# Patient Record
Sex: Female | Born: 1935 | Race: White | Hispanic: No | Marital: Married | Smoking: Never smoker
Health system: Southern US, Community
[De-identification: ages and names within clinical notes are randomized; demographics above are authoritative.]

## PROBLEM LIST (undated history)

## (undated) DIAGNOSIS — M1990 Unspecified osteoarthritis, unspecified site: Secondary | ICD-10-CM

## (undated) DIAGNOSIS — I2510 Atherosclerotic heart disease of native coronary artery without angina pectoris: Secondary | ICD-10-CM

## (undated) DIAGNOSIS — E7800 Pure hypercholesterolemia, unspecified: Secondary | ICD-10-CM

## (undated) DIAGNOSIS — Z8739 Personal history of other diseases of the musculoskeletal system and connective tissue: Secondary | ICD-10-CM

## (undated) DIAGNOSIS — Z8619 Personal history of other infectious and parasitic diseases: Secondary | ICD-10-CM

## (undated) DIAGNOSIS — I10 Essential (primary) hypertension: Secondary | ICD-10-CM

## (undated) DIAGNOSIS — D45 Polycythemia vera: Secondary | ICD-10-CM

## (undated) HISTORY — DX: Polycythemia vera: D45

## (undated) HISTORY — DX: Personal history of other diseases of the musculoskeletal system and connective tissue: Z87.39

## (undated) HISTORY — DX: Essential (primary) hypertension: I10

## (undated) HISTORY — DX: Pure hypercholesterolemia, unspecified: E78.00

## (undated) HISTORY — DX: Atherosclerotic heart disease of native coronary artery without angina pectoris: I25.10

## (undated) HISTORY — PX: CHOLECYSTECTOMY: SHX55

## (undated) HISTORY — PX: APPENDECTOMY: SHX54

## (undated) HISTORY — DX: Personal history of other infectious and parasitic diseases: Z86.19

## (undated) HISTORY — DX: Unspecified osteoarthritis, unspecified site: M19.90

---

## 1968-04-30 HISTORY — PX: ABDOMINAL HYSTERECTOMY: SHX81

## 2005-01-16 ENCOUNTER — Ambulatory Visit: Payer: Self-pay | Admitting: Internal Medicine

## 2005-02-20 ENCOUNTER — Ambulatory Visit: Payer: Self-pay | Admitting: Internal Medicine

## 2005-08-22 ENCOUNTER — Ambulatory Visit: Payer: Self-pay | Admitting: Internal Medicine

## 2005-08-29 ENCOUNTER — Ambulatory Visit: Payer: Self-pay | Admitting: Internal Medicine

## 2005-10-09 ENCOUNTER — Ambulatory Visit: Payer: Self-pay | Admitting: Urology

## 2006-01-15 ENCOUNTER — Ambulatory Visit: Payer: Self-pay | Admitting: Internal Medicine

## 2006-05-01 ENCOUNTER — Ambulatory Visit: Payer: Self-pay | Admitting: Internal Medicine

## 2006-08-06 ENCOUNTER — Ambulatory Visit: Payer: Self-pay | Admitting: Specialist

## 2007-02-24 ENCOUNTER — Ambulatory Visit: Payer: Self-pay | Admitting: Specialist

## 2007-03-02 ENCOUNTER — Emergency Department: Payer: Self-pay | Admitting: Emergency Medicine

## 2007-03-11 ENCOUNTER — Ambulatory Visit: Payer: Self-pay | Admitting: Internal Medicine

## 2007-03-17 ENCOUNTER — Ambulatory Visit: Payer: Self-pay | Admitting: Internal Medicine

## 2007-05-06 ENCOUNTER — Ambulatory Visit: Payer: Self-pay | Admitting: Internal Medicine

## 2007-07-29 ENCOUNTER — Ambulatory Visit: Payer: Self-pay | Admitting: Specialist

## 2007-10-09 ENCOUNTER — Ambulatory Visit: Payer: Self-pay | Admitting: Internal Medicine

## 2008-01-21 ENCOUNTER — Ambulatory Visit: Payer: Self-pay | Admitting: Specialist

## 2008-04-11 IMAGING — CT CT CHEST W/ CM
1 series · 15 of 33 positions shown, 19 images · IV contrast (agent unspecified)
Comparison: none

REASON FOR EXAM: abnormal chest xray Giorgi to send films from [REDACTED]
COMMENTS:

PROCEDURE:     CT  - CT CHEST WITH CONTRAST  - August 22, 2005  [DATE]
RESULT:
HISTORY: Abnormal chest x-ray.

[Series 2: soft tissue · axial · 0.61mm/px · z∈[+554,+844]mm · 15 of 68 slices shown, 19 images]
[im 5/68  mediastinal]
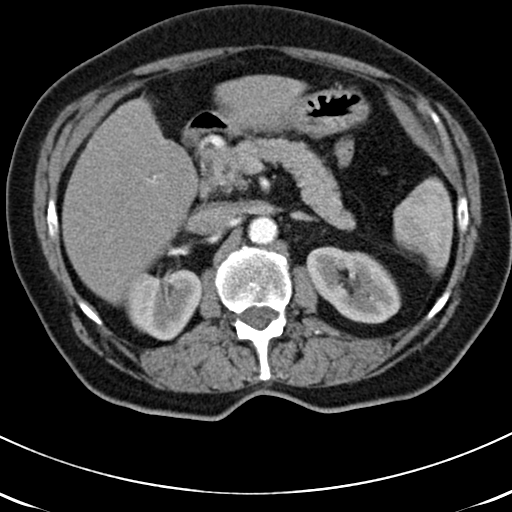
[im 5/68  lung]
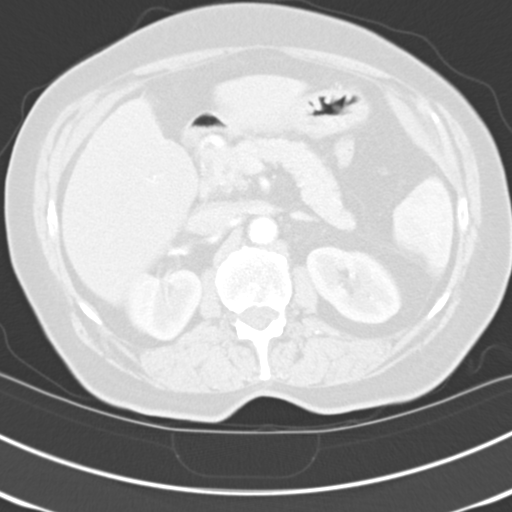
[im 10/68  lung]
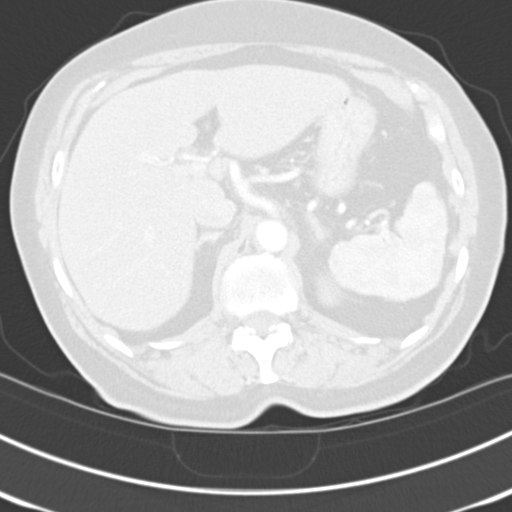
[im 14/68  lung]
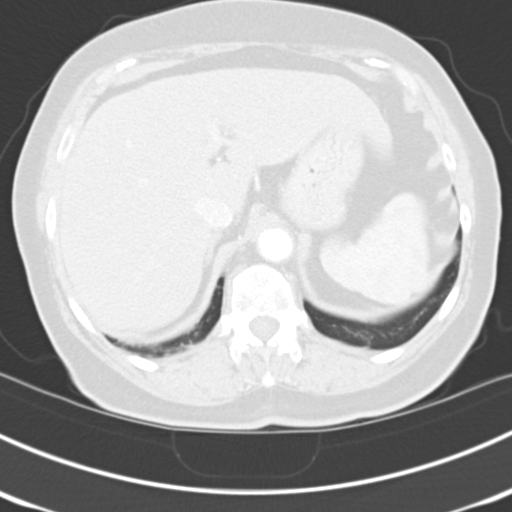
[im 18/68  lung]
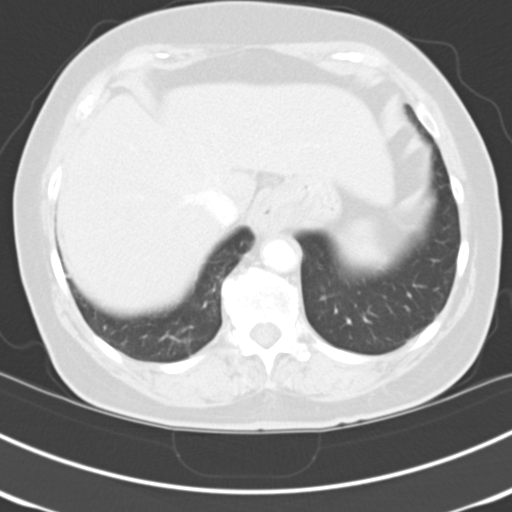
[im 23/68  mediastinal]
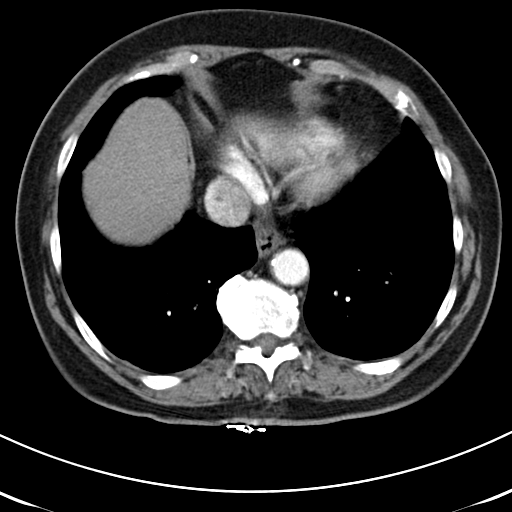
[im 23/68  lung]
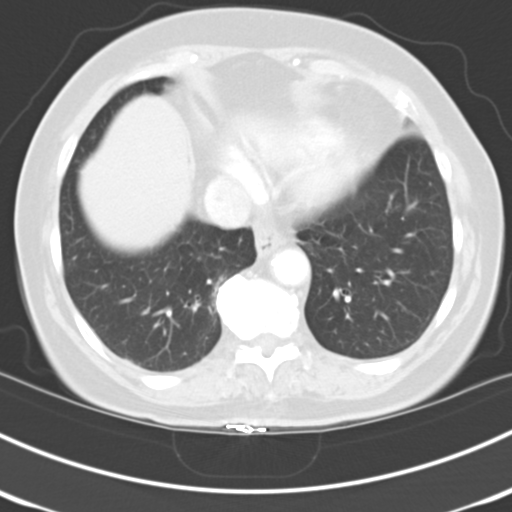
[im 27/68  lung]
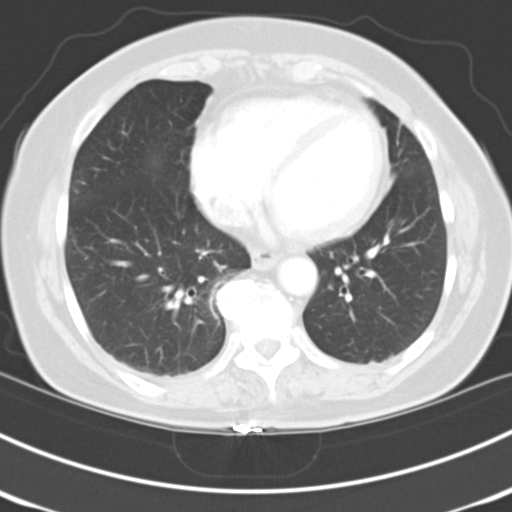
[im 30/68  lung]
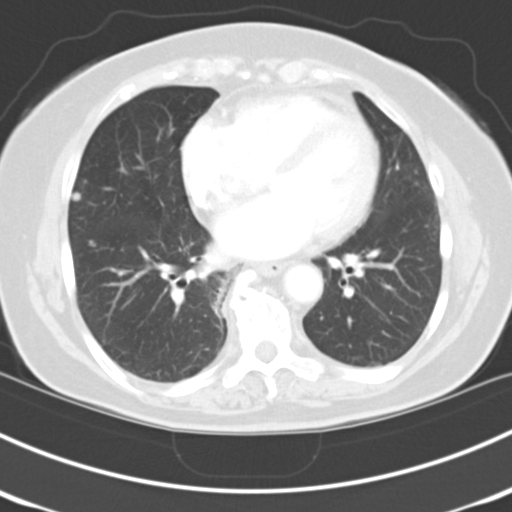
[im 35/68  lung]
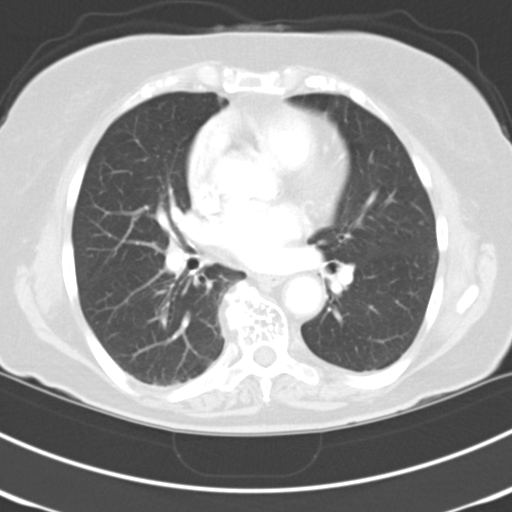
[im 38/68  mediastinal]
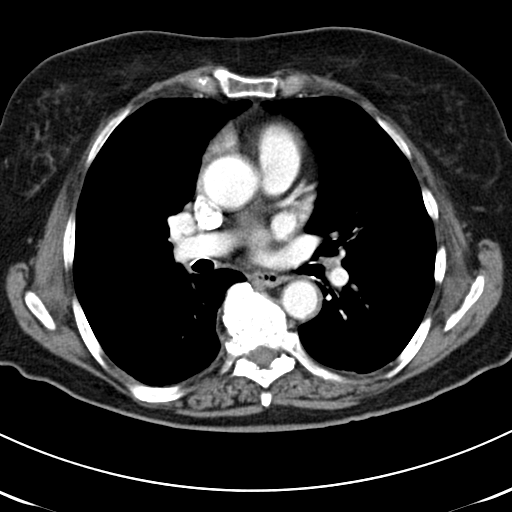
[im 38/68  lung]
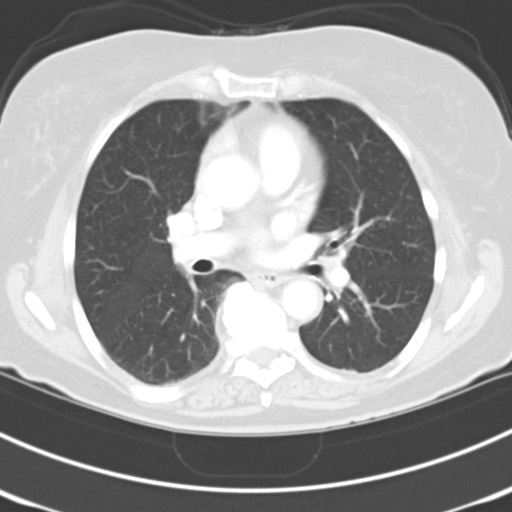
[im 41/68  lung]
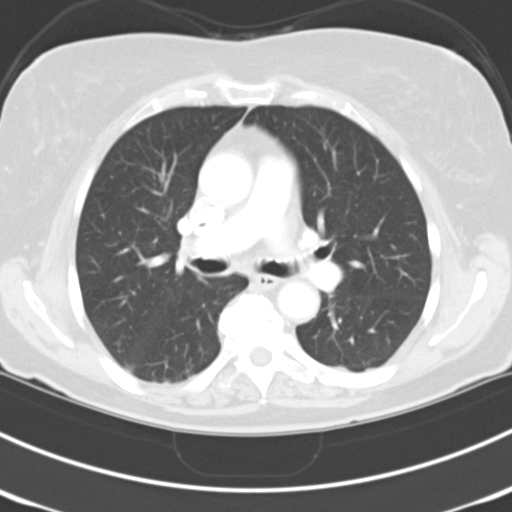
[im 45/68  lung]
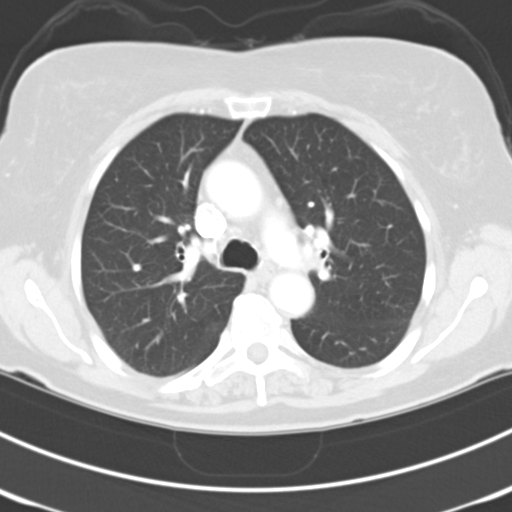
[im 50/68  lung]
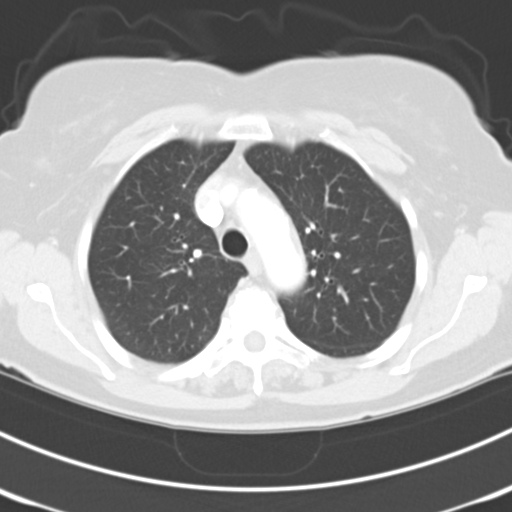
[im 54/68  mediastinal]
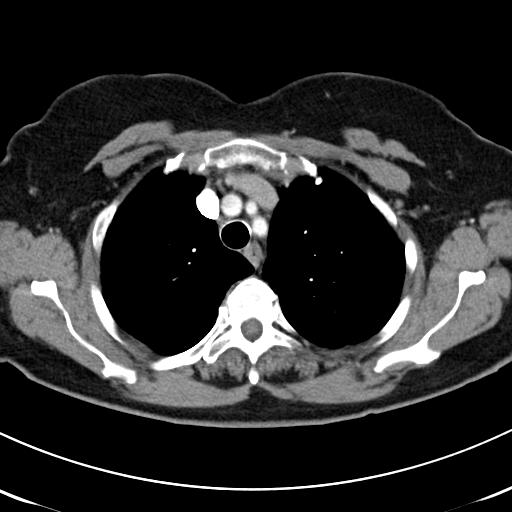
[im 54/68  lung]
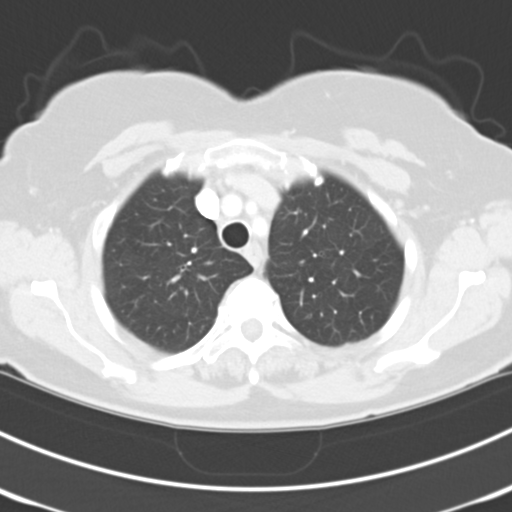
[im 58/68  lung]
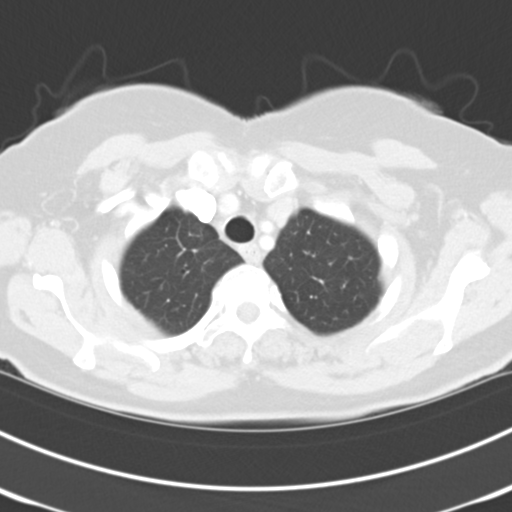
[im 63/68  lung]
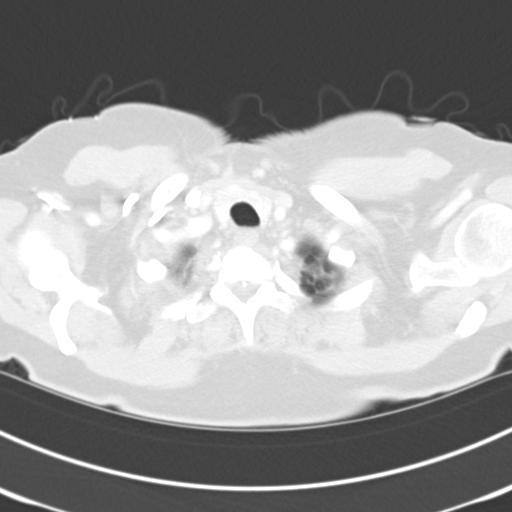

[15 of 33 positions shown; findings below may reference images not displayed]

COMPARISON STUDIES: No recent.

PROCEDURE AND FINDINGS: Shotty mediastinal lymph nodes are noted.  Coronary
artery calcification is present.  Pulmonary arteries are normal. The
adrenals are normal.  There is a tiny approximately 2 mm nodule in the
periphery of the RIGHT middle lobe.  Adjacent to this tiny pulmonary nodule
is an approximately 1 mm pulmonary nodule.  These nodules because of their
tiny size would be difficult to obtain an adequate biopsy.  PET CT would
also not likely be fruitful due to the tiny size of the nodules. We can
perform serial CT scans to follow these tiny nodules if surgical removal is
not performed.  CTs can be obtained at six-month intervals for a two-year
period.  Incidental note is made of possible RIGHT hydronephrosis on the
upper images of the abdomen that are imaged on this chest CT.  Renal
ultrasound is suggested for further evaluation. Surgical clips are noted in
the gallbladder fossa.
IMPRESSION: 1)Two, tiny, 1 to 2 mm pulmonary nodules in the periphery of the RIGHT
middle lobe. These are most likely too small for adequate percutaneous
biopsy or PET CT to be of use in evaluation. If surgical removal is not
contemplated, serial CTs at six-month intervals can be performed to
demonstrate stability.

2)Questionable hydronephrosis noted in the RIGHT kidney on the upper images
of the abdomen obtained on this Chest CT.  Renal ultrasound is suggested for
further evaluation.

## 2008-05-05 LAB — HM DEXA SCAN

## 2008-07-06 ENCOUNTER — Ambulatory Visit: Payer: Self-pay | Admitting: Internal Medicine

## 2008-07-06 LAB — HM MAMMOGRAPHY

## 2008-07-29 ENCOUNTER — Ambulatory Visit: Payer: Self-pay | Admitting: Internal Medicine

## 2008-08-18 ENCOUNTER — Ambulatory Visit: Payer: Self-pay | Admitting: Internal Medicine

## 2008-08-28 ENCOUNTER — Ambulatory Visit: Payer: Self-pay | Admitting: Internal Medicine

## 2008-08-28 HISTORY — PX: BONE MARROW BIOPSY: SHX199

## 2008-09-28 ENCOUNTER — Ambulatory Visit: Payer: Self-pay | Admitting: Internal Medicine

## 2008-10-06 ENCOUNTER — Ambulatory Visit: Payer: Self-pay | Admitting: Internal Medicine

## 2008-10-12 ENCOUNTER — Emergency Department: Payer: Self-pay | Admitting: Emergency Medicine

## 2008-10-28 ENCOUNTER — Ambulatory Visit: Payer: Self-pay | Admitting: Internal Medicine

## 2008-11-28 ENCOUNTER — Ambulatory Visit: Payer: Self-pay | Admitting: Internal Medicine

## 2008-12-29 ENCOUNTER — Ambulatory Visit: Payer: Self-pay | Admitting: Internal Medicine

## 2009-01-28 ENCOUNTER — Ambulatory Visit: Payer: Self-pay | Admitting: Internal Medicine

## 2009-02-28 ENCOUNTER — Ambulatory Visit: Payer: Self-pay | Admitting: Internal Medicine

## 2009-03-08 ENCOUNTER — Ambulatory Visit: Payer: Self-pay | Admitting: Internal Medicine

## 2009-03-26 IMAGING — CT CT CHEST W/ CM
1 series · 15 of 33 positions shown, 19 images · non-contrast
Comparison: none

REASON FOR EXAM: Pulmonary nodules
COMMENTS:

[Series 2: soft tissue · axial · 0.62mm/px · z∈[-185,+80]mm · 15 of 63 slices shown, 19 images]
[im 5/63  mediastinal]
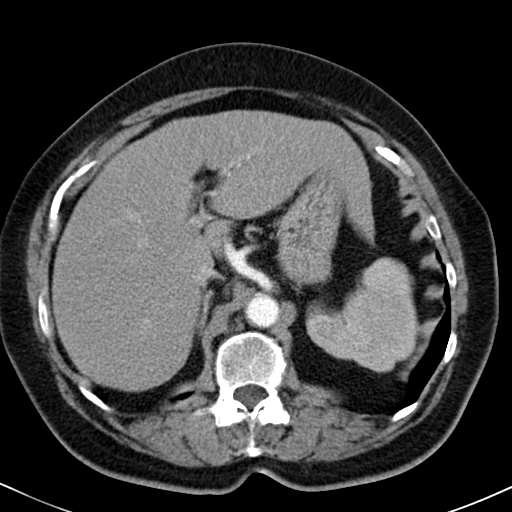
[im 5/63  lung]
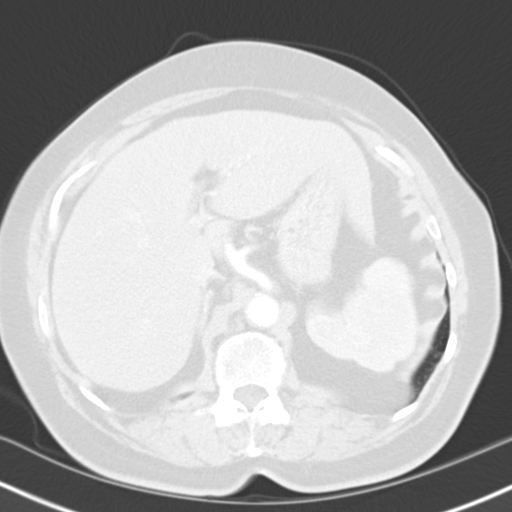
[im 10/63  lung]
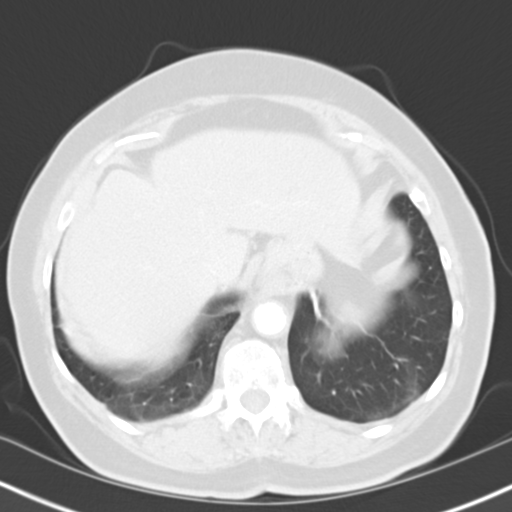
[im 13/63  lung]
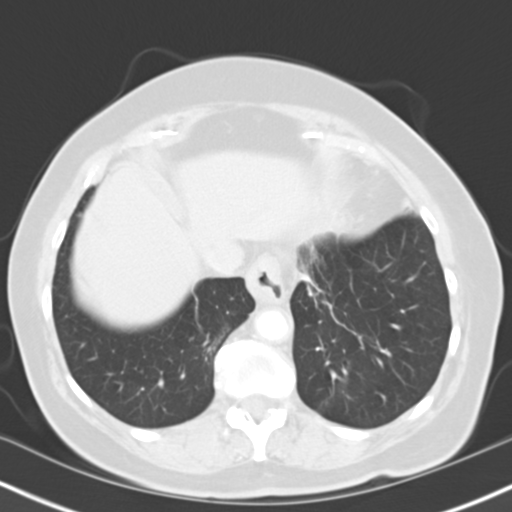
[im 17/63  lung]
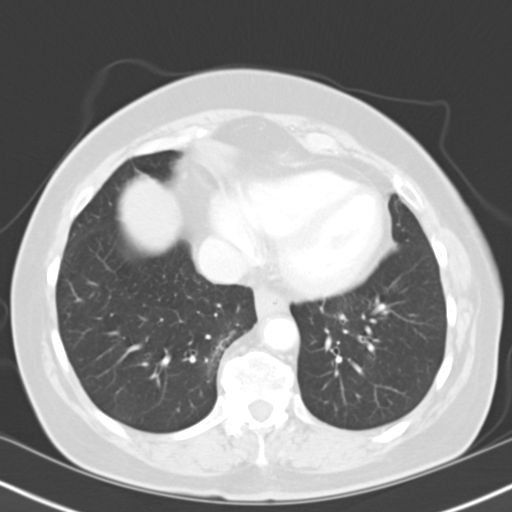
[im 21/63  mediastinal]
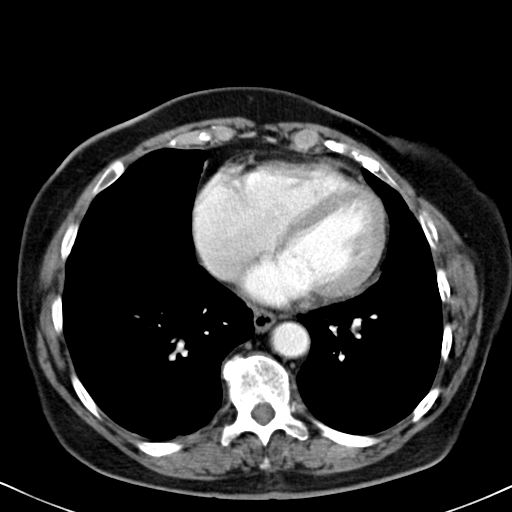
[im 21/63  lung]
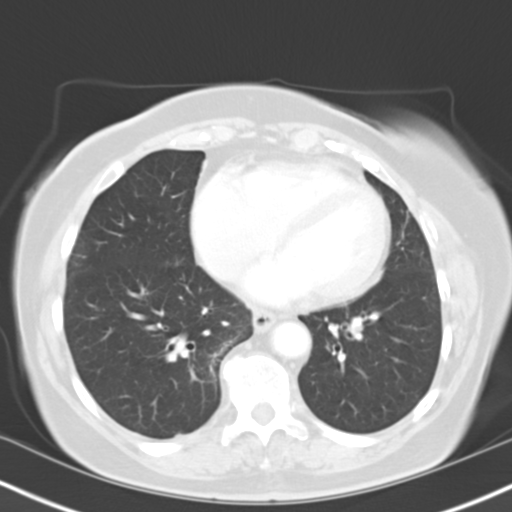
[im 25/63  lung]
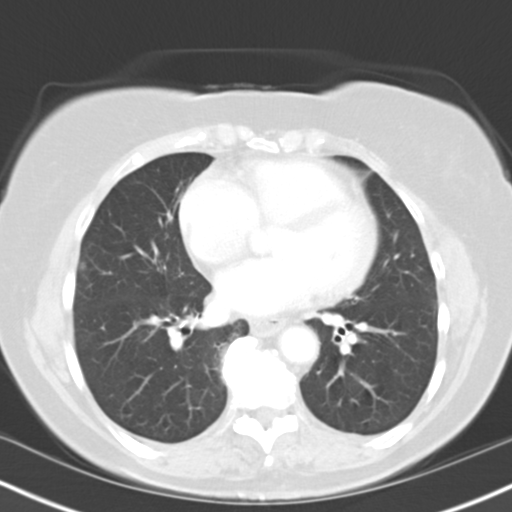
[im 28/63  lung]
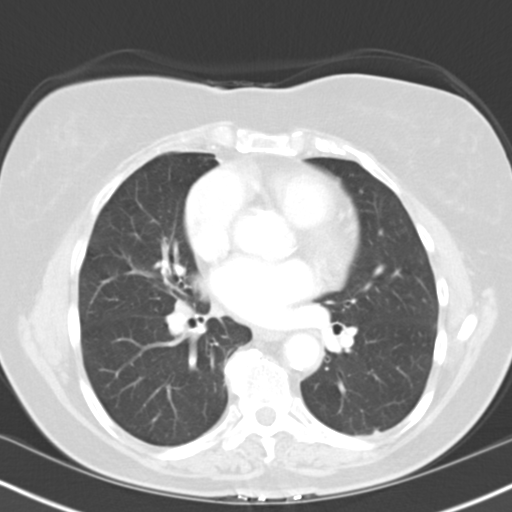
[im 33/63  lung]
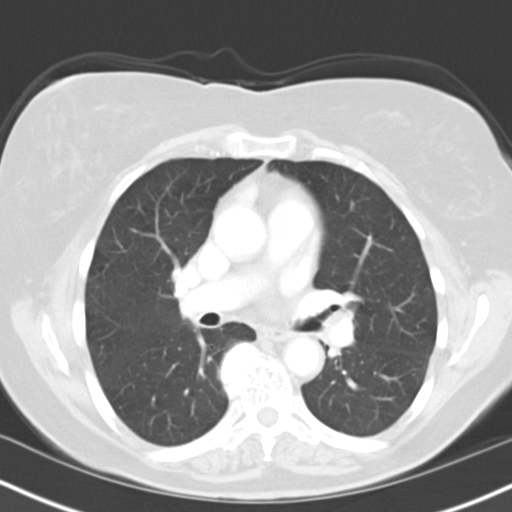
[im 35/63  mediastinal]
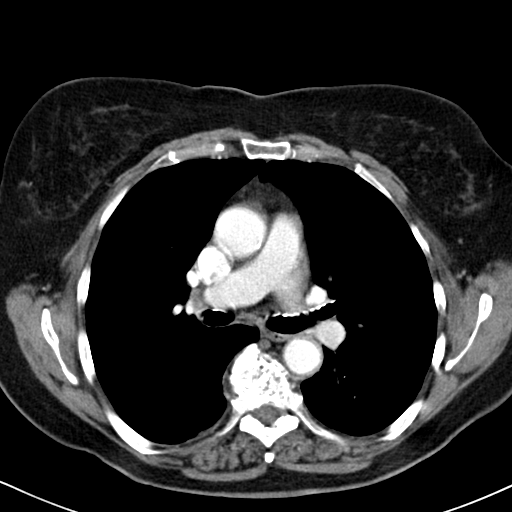
[im 35/63  lung]
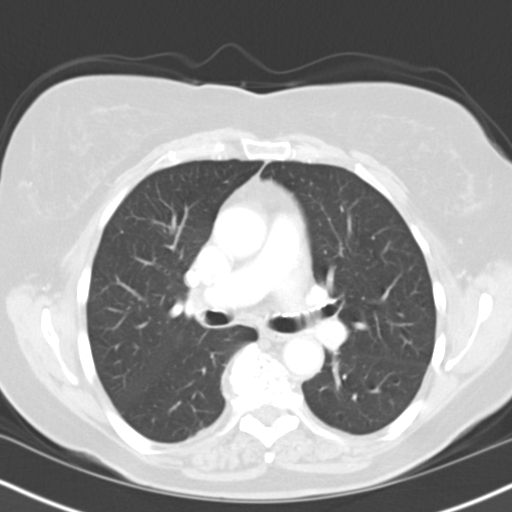
[im 38/63  lung]
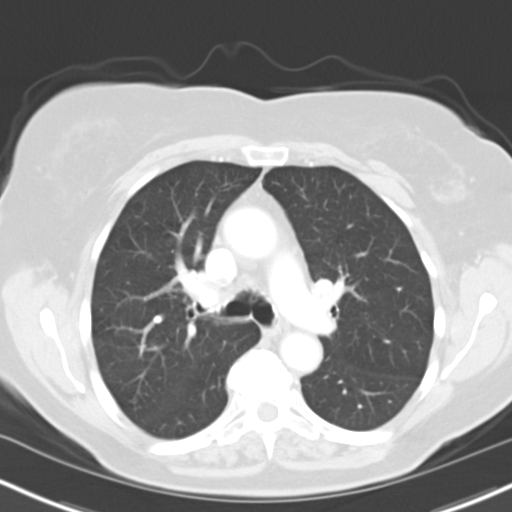
[im 42/63  lung]
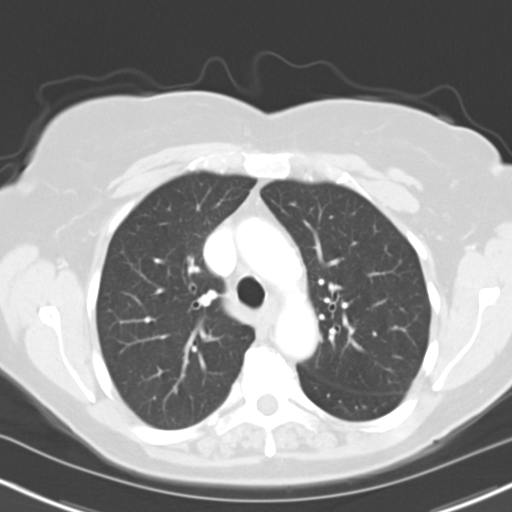
[im 46/63  lung]
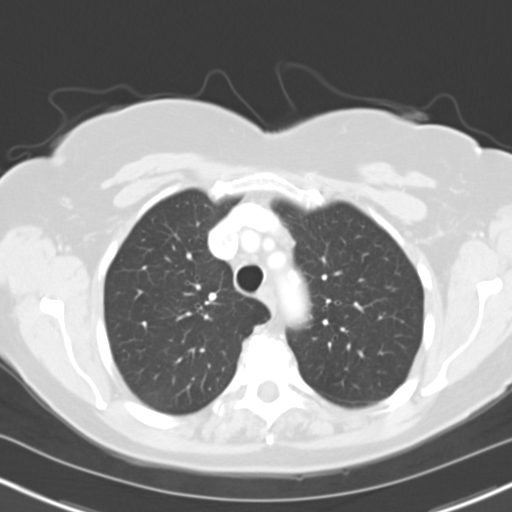
[im 50/63  mediastinal]
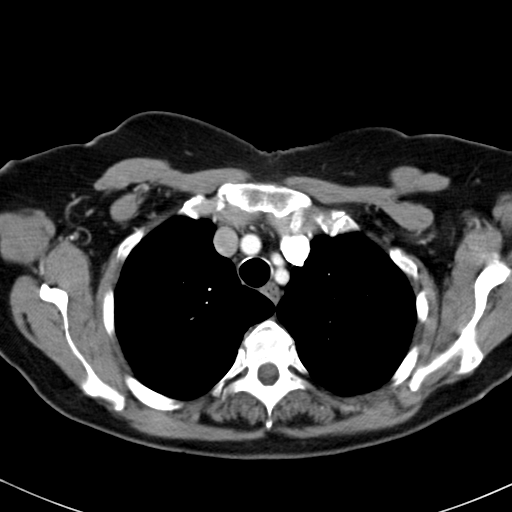
[im 50/63  lung]
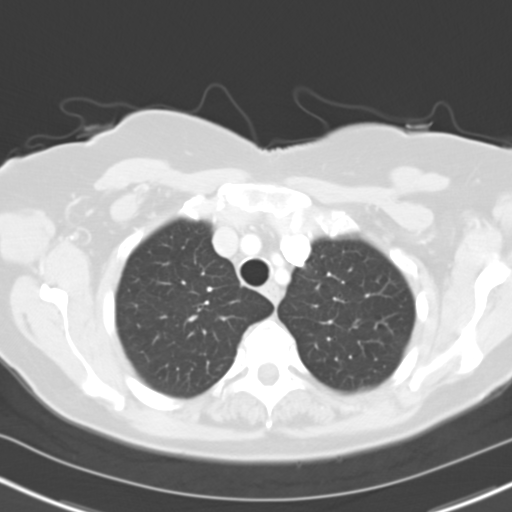
[im 53/63  lung]
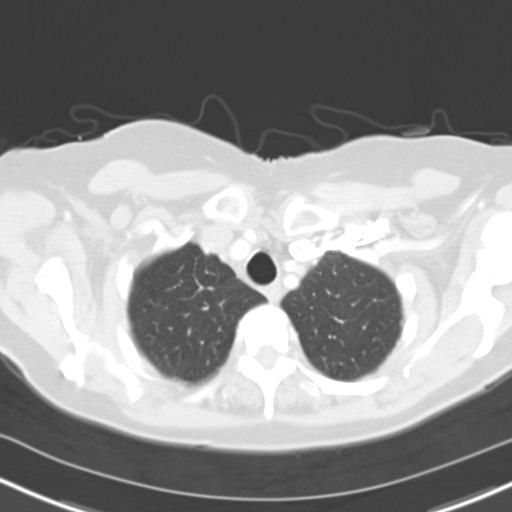
[im 58/63  lung]
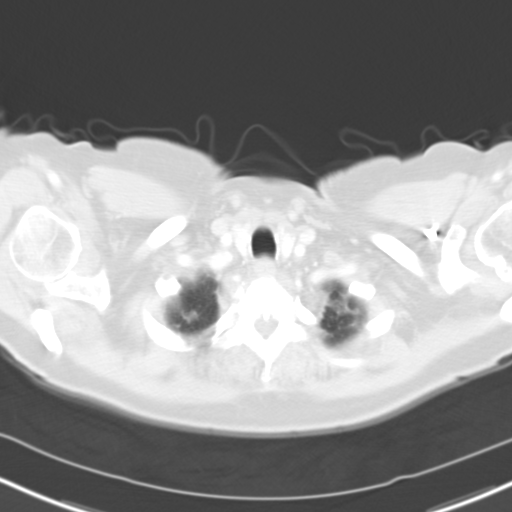

[15 of 33 positions shown; findings below may reference images not displayed]

PROCEDURE:     CT  - CT CHEST WITH CONTRAST  - August 06, 2006 [DATE]

RESULT:     Multislice helical acquisition through the chest is compared to
the study of 01/15/06.  There is a hiatal hernia present. The heart is at the
upper limits of normal in size. There is no effusion. There is no
mediastinal or hilar mass, or adenopathy. The lung window images again show
tiny areas of nodularity at the level of the minor fissure on the RIGHT. The
largest of these is on image #40 and measures up to as much as 5.5 mm on the
lung window settings. These areas appear to be stable and show no
calcification.  These are not evident on the mediastinal window settings. No
new nodules are present. There is no effusion or pneumothorax. Degenerative
spurring is present in the thoracic spine.
IMPRESSION: Stable chest CT with stable areas of minimal nodularity. These are stable
when compared to previous images dating back to Friday July, 2005. These must be
followed for another year to document stability. Six-month followup would be
suggested.

## 2009-03-30 ENCOUNTER — Ambulatory Visit: Payer: Self-pay | Admitting: Internal Medicine

## 2009-04-30 ENCOUNTER — Ambulatory Visit: Payer: Self-pay | Admitting: Internal Medicine

## 2009-05-03 ENCOUNTER — Ambulatory Visit: Payer: Self-pay | Admitting: Internal Medicine

## 2009-05-31 ENCOUNTER — Ambulatory Visit: Payer: Self-pay | Admitting: Internal Medicine

## 2009-06-28 ENCOUNTER — Ambulatory Visit: Payer: Self-pay | Admitting: Internal Medicine

## 2009-07-06 ENCOUNTER — Ambulatory Visit: Payer: Self-pay | Admitting: Internal Medicine

## 2009-07-29 ENCOUNTER — Ambulatory Visit: Payer: Self-pay | Admitting: Internal Medicine

## 2009-08-28 ENCOUNTER — Ambulatory Visit: Payer: Self-pay | Admitting: Internal Medicine

## 2009-09-06 ENCOUNTER — Ambulatory Visit: Payer: Self-pay | Admitting: Internal Medicine

## 2009-09-28 ENCOUNTER — Ambulatory Visit: Payer: Self-pay | Admitting: Internal Medicine

## 2009-10-28 ENCOUNTER — Ambulatory Visit: Payer: Self-pay | Admitting: Internal Medicine

## 2009-11-10 ENCOUNTER — Ambulatory Visit: Payer: Self-pay | Admitting: Internal Medicine

## 2009-11-28 ENCOUNTER — Ambulatory Visit: Payer: Self-pay | Admitting: Internal Medicine

## 2009-12-29 ENCOUNTER — Ambulatory Visit: Payer: Self-pay | Admitting: Internal Medicine

## 2010-01-17 ENCOUNTER — Ambulatory Visit: Payer: Self-pay | Admitting: Internal Medicine

## 2010-01-28 ENCOUNTER — Ambulatory Visit: Payer: Self-pay | Admitting: Internal Medicine

## 2010-04-25 ENCOUNTER — Ambulatory Visit: Payer: Self-pay | Admitting: Internal Medicine

## 2010-04-30 ENCOUNTER — Ambulatory Visit: Payer: Self-pay | Admitting: Internal Medicine

## 2010-08-24 ENCOUNTER — Emergency Department: Payer: Self-pay | Admitting: Internal Medicine

## 2010-08-29 ENCOUNTER — Ambulatory Visit: Payer: Self-pay | Admitting: Internal Medicine

## 2010-09-29 ENCOUNTER — Ambulatory Visit: Payer: Self-pay | Admitting: Internal Medicine

## 2010-12-17 ENCOUNTER — Emergency Department: Payer: Self-pay | Admitting: *Deleted

## 2010-12-23 ENCOUNTER — Emergency Department: Payer: Self-pay | Admitting: *Deleted

## 2011-03-06 ENCOUNTER — Ambulatory Visit: Payer: Self-pay | Admitting: Ophthalmology

## 2011-03-06 HISTORY — PX: CATARACT EXTRACTION: SUR2

## 2011-03-13 ENCOUNTER — Ambulatory Visit: Payer: Self-pay | Admitting: Internal Medicine

## 2011-03-31 ENCOUNTER — Ambulatory Visit: Payer: Self-pay | Admitting: Internal Medicine

## 2011-09-11 ENCOUNTER — Ambulatory Visit: Payer: Self-pay | Admitting: Ophthalmology

## 2011-09-26 ENCOUNTER — Ambulatory Visit: Payer: Self-pay | Admitting: Internal Medicine

## 2011-09-26 LAB — CBC CANCER CENTER
Basophil #: 0.1 x10 3/mm (ref 0.0–0.1)
Basophil %: 1 %
Eosinophil #: 0.2 x10 3/mm (ref 0.0–0.7)
Eosinophil %: 2 %
HCT: 43.5 % (ref 35.0–47.0)
Lymphocyte #: 2.2 x10 3/mm (ref 1.0–3.6)
MCH: 34.6 pg — ABNORMAL HIGH (ref 26.0–34.0)
MCHC: 32.6 g/dL (ref 32.0–36.0)
MCV: 106 fL — ABNORMAL HIGH (ref 80–100)
Monocyte #: 1.1 x10 3/mm — ABNORMAL HIGH (ref 0.2–0.9)
Neutrophil %: 62.1 %
Platelet: 347 x10 3/mm (ref 150–440)
RBC: 4.09 10*6/uL (ref 3.80–5.20)
RDW: 14.5 % (ref 11.5–14.5)

## 2011-09-29 ENCOUNTER — Ambulatory Visit: Payer: Self-pay | Admitting: Internal Medicine

## 2011-10-02 LAB — CBC CANCER CENTER
Basophil %: 1.9 %
Eosinophil #: 0.2 x10 3/mm (ref 0.0–0.7)
Eosinophil %: 2.7 %
HCT: 44.2 % (ref 35.0–47.0)
Lymphocyte %: 24 %
MCH: 35 pg — ABNORMAL HIGH (ref 26.0–34.0)
MCHC: 32.6 g/dL (ref 32.0–36.0)
Monocyte #: 0.9 x10 3/mm (ref 0.2–0.9)
Neutrophil #: 5.7 x10 3/mm (ref 1.4–6.5)
Neutrophil %: 61.7 %
Platelet: 326 x10 3/mm (ref 150–440)
RDW: 14.4 % (ref 11.5–14.5)

## 2011-10-09 LAB — CBC CANCER CENTER
Basophil #: 0.1 x10 3/mm (ref 0.0–0.1)
HGB: 14.5 g/dL (ref 12.0–16.0)
Lymphocyte %: 24 %
MCH: 35.5 pg — ABNORMAL HIGH (ref 26.0–34.0)
MCHC: 33.2 g/dL (ref 32.0–36.0)
MCV: 107 fL — ABNORMAL HIGH (ref 80–100)
Monocyte %: 11.1 %
Neutrophil %: 62 %
Platelet: 335 x10 3/mm (ref 150–440)
RDW: 14.8 % — ABNORMAL HIGH (ref 11.5–14.5)
WBC: 9.1 x10 3/mm (ref 3.6–11.0)

## 2011-10-16 LAB — CBC CANCER CENTER
Basophil #: 0 x10 3/mm (ref 0.0–0.1)
Lymphocyte #: 2.1 x10 3/mm (ref 1.0–3.6)
Lymphocyte %: 22.4 %
MCH: 35.2 pg — ABNORMAL HIGH (ref 26.0–34.0)
MCHC: 32.5 g/dL (ref 32.0–36.0)
Monocyte %: 8.4 %
Neutrophil #: 6.2 x10 3/mm (ref 1.4–6.5)
Neutrophil %: 66.2 %
WBC: 9.4 x10 3/mm (ref 3.6–11.0)

## 2011-10-22 LAB — CBC CANCER CENTER
Basophil #: 0.2 x10 3/mm — ABNORMAL HIGH (ref 0.0–0.1)
Basophil %: 2.2 %
Eosinophil #: 0.2 x10 3/mm (ref 0.0–0.7)
Eosinophil %: 2.1 %
HCT: 43.7 % (ref 35.0–47.0)
MCH: 35.1 pg — ABNORMAL HIGH (ref 26.0–34.0)
MCHC: 32.4 g/dL (ref 32.0–36.0)
Monocyte #: 0.9 x10 3/mm (ref 0.2–0.9)
Monocyte %: 10.6 %
Neutrophil #: 5.2 x10 3/mm (ref 1.4–6.5)
Platelet: 322 x10 3/mm (ref 150–440)
RBC: 4.04 10*6/uL (ref 3.80–5.20)
RDW: 14.4 % (ref 11.5–14.5)

## 2011-10-29 ENCOUNTER — Ambulatory Visit: Payer: Self-pay | Admitting: Internal Medicine

## 2011-11-19 LAB — CBC CANCER CENTER
Basophil %: 1.2 %
Eosinophil #: 0.2 x10 3/mm (ref 0.0–0.7)
Eosinophil %: 1.8 %
HCT: 44.6 % (ref 35.0–47.0)
HGB: 14.4 g/dL (ref 12.0–16.0)
Lymphocyte %: 24 %
MCH: 35.1 pg — ABNORMAL HIGH (ref 26.0–34.0)
MCHC: 32.2 g/dL (ref 32.0–36.0)
Neutrophil %: 63.6 %
Platelet: 315 x10 3/mm (ref 150–440)
WBC: 9.5 x10 3/mm (ref 3.6–11.0)

## 2011-11-26 LAB — CBC CANCER CENTER
Basophil #: 0.1 x10 3/mm (ref 0.0–0.1)
Basophil %: 1.1 %
HGB: 14.1 g/dL (ref 12.0–16.0)
Lymphocyte #: 1.9 x10 3/mm (ref 1.0–3.6)
Lymphocyte %: 24.9 %
MCV: 109 fL — ABNORMAL HIGH (ref 80–100)
Monocyte #: 0.6 x10 3/mm (ref 0.2–0.9)
Monocyte %: 8.5 %
Neutrophil %: 63.8 %
Platelet: 323 x10 3/mm (ref 150–440)
RDW: 14.9 % — ABNORMAL HIGH (ref 11.5–14.5)
WBC: 7.5 x10 3/mm (ref 3.6–11.0)

## 2011-11-29 ENCOUNTER — Ambulatory Visit: Payer: Self-pay | Admitting: Internal Medicine

## 2011-12-06 LAB — CBC CANCER CENTER
Basophil #: 0.1 x10 3/mm (ref 0.0–0.1)
Basophil %: 1.2 %
Eosinophil %: 2 %
HCT: 42.7 % (ref 35.0–47.0)
HGB: 14.4 g/dL (ref 12.0–16.0)
MCH: 37.5 pg — ABNORMAL HIGH (ref 26.0–34.0)
MCV: 111 fL — ABNORMAL HIGH (ref 80–100)
Monocyte #: 0.6 x10 3/mm (ref 0.2–0.9)
Neutrophil %: 65.3 %
RBC: 3.84 10*6/uL (ref 3.80–5.20)
RDW: 15.1 % — ABNORMAL HIGH (ref 11.5–14.5)

## 2011-12-18 LAB — CBC CANCER CENTER
Basophil #: 0.1 x10 3/mm (ref 0.0–0.1)
Eosinophil #: 0.2 x10 3/mm (ref 0.0–0.7)
HCT: 41.4 % (ref 35.0–47.0)
HGB: 13.4 g/dL (ref 12.0–16.0)
Lymphocyte %: 23.4 %
MCH: 36.1 pg — ABNORMAL HIGH (ref 26.0–34.0)
MCHC: 32.4 g/dL (ref 32.0–36.0)
MCV: 112 fL — ABNORMAL HIGH (ref 80–100)
Monocyte #: 0.7 x10 3/mm (ref 0.2–0.9)
Monocyte %: 8.4 %
Neutrophil #: 5.2 x10 3/mm (ref 1.4–6.5)
Neutrophil %: 64.9 %
RDW: 15.7 % — ABNORMAL HIGH (ref 11.5–14.5)

## 2011-12-30 ENCOUNTER — Ambulatory Visit: Payer: Self-pay | Admitting: Internal Medicine

## 2012-01-03 LAB — CBC CANCER CENTER
Basophil #: 0.1 x10 3/mm (ref 0.0–0.1)
Basophil %: 1.1 %
HCT: 41.6 % (ref 35.0–47.0)
HGB: 13.4 g/dL (ref 12.0–16.0)
Lymphocyte #: 2.1 x10 3/mm (ref 1.0–3.6)
MCH: 36.3 pg — ABNORMAL HIGH (ref 26.0–34.0)
MCHC: 32.2 g/dL (ref 32.0–36.0)
MCV: 113 fL — ABNORMAL HIGH (ref 80–100)
Monocyte %: 6.1 %
Neutrophil #: 6 x10 3/mm (ref 1.4–6.5)
RDW: 15.7 % — ABNORMAL HIGH (ref 11.5–14.5)
WBC: 8.9 x10 3/mm (ref 3.6–11.0)

## 2012-01-17 LAB — CBC CANCER CENTER
Basophil #: 0.1 x10 3/mm (ref 0.0–0.1)
Eosinophil #: 0.2 x10 3/mm (ref 0.0–0.7)
HCT: 41.8 % (ref 35.0–47.0)
HGB: 13.5 g/dL (ref 12.0–16.0)
Lymphocyte #: 1.8 x10 3/mm (ref 1.0–3.6)
Lymphocyte %: 20.7 %
MCHC: 32.3 g/dL (ref 32.0–36.0)
MCV: 114 fL — ABNORMAL HIGH (ref 80–100)
Monocyte #: 0.5 x10 3/mm (ref 0.2–0.9)
Monocyte %: 6.1 %
Neutrophil %: 70 %
Platelet: 252 x10 3/mm (ref 150–440)
RDW: 15.4 % — ABNORMAL HIGH (ref 11.5–14.5)
WBC: 8.6 x10 3/mm (ref 3.6–11.0)

## 2012-01-29 ENCOUNTER — Ambulatory Visit: Payer: Self-pay | Admitting: Internal Medicine

## 2012-01-31 LAB — CBC CANCER CENTER
Eosinophil #: 0.2 x10 3/mm (ref 0.0–0.7)
HCT: 41.1 % (ref 35.0–47.0)
Lymphocyte #: 2.3 x10 3/mm (ref 1.0–3.6)
MCH: 37.2 pg — ABNORMAL HIGH (ref 26.0–34.0)
MCV: 114 fL — ABNORMAL HIGH (ref 80–100)
Neutrophil #: 5.3 x10 3/mm (ref 1.4–6.5)
Platelet: 263 x10 3/mm (ref 150–440)
RDW: 15.6 % — ABNORMAL HIGH (ref 11.5–14.5)

## 2012-02-25 LAB — CBC CANCER CENTER
Basophil #: 0.1 x10 3/mm (ref 0.0–0.1)
Eosinophil #: 0.2 x10 3/mm (ref 0.0–0.7)
Eosinophil %: 2.5 %
HCT: 42.2 % (ref 35.0–47.0)
Lymphocyte #: 2 x10 3/mm (ref 1.0–3.6)
Lymphocyte %: 26.4 %
MCH: 37.2 pg — ABNORMAL HIGH (ref 26.0–34.0)
MCHC: 32.2 g/dL (ref 32.0–36.0)
Monocyte #: 0.6 x10 3/mm (ref 0.2–0.9)
Neutrophil #: 4.5 x10 3/mm (ref 1.4–6.5)
Neutrophil %: 61.4 %
RDW: 15 % — ABNORMAL HIGH (ref 11.5–14.5)

## 2012-02-29 ENCOUNTER — Ambulatory Visit: Payer: Self-pay | Admitting: Internal Medicine

## 2012-03-05 LAB — CANCER CENTER HEMATOCRIT: HCT: 43.2 % (ref 35.0–47.0)

## 2012-03-11 ENCOUNTER — Telehealth: Payer: Self-pay | Admitting: Internal Medicine

## 2012-03-11 NOTE — Telephone Encounter (Signed)
Tina Bond left message wanting someone to call her back she has ? About her meds

## 2012-03-12 NOTE — Telephone Encounter (Signed)
Patient wants to know how she can get her meds refilled through [facility], if you will let them know that she going coming to you for her perscription? She is con concerned that they want pay. Will check with pharmacy to make sure her meds are right.

## 2012-03-12 NOTE — Telephone Encounter (Signed)
Called and spoke to patient.  She does not need refills now.  Will have her current med list faxed from [facility] and she will call back when she needs her prescriptions sent in to [facility].

## 2012-03-14 ENCOUNTER — Encounter: Payer: Self-pay | Admitting: Internal Medicine

## 2012-03-30 ENCOUNTER — Ambulatory Visit: Payer: Self-pay | Admitting: Internal Medicine

## 2012-04-03 LAB — CBC CANCER CENTER
Basophil #: 0.1 x10 3/mm (ref 0.0–0.1)
Basophil %: 1.4 %
Eosinophil #: 0.2 x10 3/mm (ref 0.0–0.7)
Eosinophil %: 2.6 %
HCT: 40 % (ref 35.0–47.0)
HGB: 13.5 g/dL (ref 12.0–16.0)
Lymphocyte #: 2 x10 3/mm (ref 1.0–3.6)
Lymphocyte %: 22.2 %
MCH: 37.5 pg — ABNORMAL HIGH (ref 26.0–34.0)
MCHC: 33.7 g/dL (ref 32.0–36.0)
MCV: 112 fL — ABNORMAL HIGH (ref 80–100)
Monocyte #: 0.7 x10 3/mm (ref 0.2–0.9)
Monocyte %: 8.1 %
Neutrophil #: 5.9 x10 3/mm (ref 1.4–6.5)
Neutrophil %: 65.7 %
Platelet: 282 x10 3/mm (ref 150–440)
RBC: 3.59 10*6/uL — ABNORMAL LOW (ref 3.80–5.20)
RDW: 14 % (ref 11.5–14.5)
WBC: 9 x10 3/mm (ref 3.6–11.0)

## 2012-04-14 ENCOUNTER — Other Ambulatory Visit: Payer: Self-pay | Admitting: Internal Medicine

## 2012-04-14 NOTE — Telephone Encounter (Signed)
Pt is needing B/P meds (Generic) refilled. She uses [facility].

## 2012-04-17 NOTE — Telephone Encounter (Signed)
Called patient to make sure that [facility] is the right pharmacy. Left message for patient to return call

## 2012-04-18 MED ORDER — AMLODIPINE BESYLATE 10 MG PO TABS: 10.0000 mg | ORAL_TABLET | Freq: Every day | ORAL | Status: DC

## 2012-04-18 MED ORDER — LISINOPRIL 40 MG PO TABS: 40.0000 mg | ORAL_TABLET | Freq: Every day | ORAL | Status: DC

## 2012-04-18 NOTE — Telephone Encounter (Signed)
Sent in to pharmacy.  

## 2012-04-24 ENCOUNTER — Other Ambulatory Visit: Payer: Self-pay | Admitting: Internal Medicine

## 2012-04-24 MED ORDER — SERTRALINE HCL 50 MG PO TABS: 50.0000 mg | ORAL_TABLET | Freq: Every day | ORAL | Status: DC

## 2012-04-24 NOTE — Telephone Encounter (Signed)
Pt has not been seen here yet. Pt has an appt on 06/10/12.

## 2012-04-24 NOTE — Telephone Encounter (Signed)
Called in [facility] - zoloft 50mg  q day #30 with 2 refills.

## 2012-04-24 NOTE — Telephone Encounter (Signed)
Pt is needing refill on Sertraline HCL 50 mg tablets QTY 30

## 2012-04-28 ENCOUNTER — Other Ambulatory Visit: Payer: Self-pay | Admitting: Internal Medicine

## 2012-04-28 NOTE — Telephone Encounter (Signed)
Atorvastatin 40 mg tabs  Take 1 tablet daily  # 90

## 2012-04-29 MED ORDER — ATORVASTATIN CALCIUM 40 MG PO TABS: 40.0000 mg | ORAL_TABLET | Freq: Every day | ORAL | Status: DC

## 2012-04-29 NOTE — Telephone Encounter (Signed)
Sent in to pharmacy.  

## 2012-04-30 ENCOUNTER — Ambulatory Visit: Payer: Self-pay | Admitting: Internal Medicine

## 2012-05-08 LAB — CBC CANCER CENTER
Basophil #: 0.1 x10 3/mm (ref 0.0–0.1)
Basophil %: 1.4 %
Eosinophil %: 2.4 %
HGB: 13.5 g/dL (ref 12.0–16.0)
MCHC: 33.8 g/dL (ref 32.0–36.0)
MCV: 110 fL — ABNORMAL HIGH (ref 80–100)
Monocyte #: 0.7 x10 3/mm (ref 0.2–0.9)
Monocyte %: 8.6 %
Neutrophil %: 66.8 %
RBC: 3.61 10*6/uL — ABNORMAL LOW (ref 3.80–5.20)
RDW: 14.1 % (ref 11.5–14.5)

## 2012-05-13 ENCOUNTER — Other Ambulatory Visit: Payer: Self-pay | Admitting: Internal Medicine

## 2012-05-13 MED ORDER — HYDROCHLOROTHIAZIDE 25 MG PO TABS: 25.0000 mg | ORAL_TABLET | Freq: Every day | ORAL | Status: DC

## 2012-05-13 NOTE — Telephone Encounter (Signed)
Sent in to pharmacy.  

## 2012-05-13 NOTE — Telephone Encounter (Signed)
Ok #90 with no refills.  Will need to keep appt

## 2012-05-13 NOTE — Telephone Encounter (Signed)
Do you want to give patient 90 day supply? We have not seen her in this office yet. Has appointment 06/10/12. Please advise?

## 2012-05-13 NOTE — Telephone Encounter (Signed)
hydrochlorothiazide (HYDRODIURIL) 25 MG tablet °# 90 °

## 2012-05-22 ENCOUNTER — Other Ambulatory Visit: Payer: Self-pay | Admitting: Internal Medicine

## 2012-05-22 NOTE — Telephone Encounter (Signed)
Pt called, needing BP med refill on Amlodipine besylate 10 mg, gets through [facility].  Pt has a New Pt appt scheduled 06/10/2012 with Dr. Lorin Picket.

## 2012-05-26 NOTE — Telephone Encounter (Signed)
Sent in to pharmacy.  

## 2012-05-31 ENCOUNTER — Ambulatory Visit: Payer: Self-pay | Admitting: Internal Medicine

## 2012-06-06 ENCOUNTER — Encounter: Payer: Self-pay | Admitting: Internal Medicine

## 2012-06-10 ENCOUNTER — Ambulatory Visit (INDEPENDENT_AMBULATORY_CARE_PROVIDER_SITE_OTHER): Payer: 59 | Admitting: Internal Medicine

## 2012-06-10 ENCOUNTER — Encounter: Payer: Self-pay | Admitting: Internal Medicine

## 2012-06-10 VITALS — BP 130/60 | HR 86 | Temp 97.9°F | Ht 64.5 in | Wt 171.5 lb

## 2012-06-10 DIAGNOSIS — I10 Essential (primary) hypertension: Secondary | ICD-10-CM

## 2012-06-10 DIAGNOSIS — F329 Major depressive disorder, single episode, unspecified: Secondary | ICD-10-CM

## 2012-06-10 DIAGNOSIS — K5730 Diverticulosis of large intestine without perforation or abscess without bleeding: Secondary | ICD-10-CM

## 2012-06-10 DIAGNOSIS — K5790 Diverticulosis of intestine, part unspecified, without perforation or abscess without bleeding: Secondary | ICD-10-CM

## 2012-06-10 DIAGNOSIS — E7800 Pure hypercholesterolemia, unspecified: Secondary | ICD-10-CM

## 2012-06-10 DIAGNOSIS — D45 Polycythemia vera: Secondary | ICD-10-CM

## 2012-06-12 ENCOUNTER — Encounter: Payer: Self-pay | Admitting: Internal Medicine

## 2012-06-12 ENCOUNTER — Other Ambulatory Visit: Payer: Self-pay | Admitting: Internal Medicine

## 2012-06-12 DIAGNOSIS — K5790 Diverticulosis of intestine, part unspecified, without perforation or abscess without bleeding: Secondary | ICD-10-CM | POA: Insufficient documentation

## 2012-06-12 DIAGNOSIS — F329 Major depressive disorder, single episode, unspecified: Secondary | ICD-10-CM | POA: Insufficient documentation

## 2012-06-12 DIAGNOSIS — F32A Depression, unspecified: Secondary | ICD-10-CM | POA: Insufficient documentation

## 2012-06-12 DIAGNOSIS — D45 Polycythemia vera: Secondary | ICD-10-CM | POA: Insufficient documentation

## 2012-06-12 MED ORDER — HYDROCHLOROTHIAZIDE 25 MG PO TABS: 25.0000 mg | ORAL_TABLET | Freq: Every day | ORAL | Status: DC

## 2012-06-12 MED ORDER — AMLODIPINE BESYLATE 10 MG PO TABS: 10.0000 mg | ORAL_TABLET | Freq: Every day | ORAL | Status: DC

## 2012-06-12 NOTE — Assessment & Plan Note (Signed)
On zoloft and doing well.  Follow.   

## 2012-06-12 NOTE — Progress Notes (Signed)
Subjective:    Patient ID: Tina Bond, female    DOB: 03/05/1921, 79 y.o.   MRN: [id number]  HPI 79 year old female with past history of hypertension, hypercholesterolemia, osteoarthritis, polycythemia vera and CAD s/p heart cath revealing insignificant two vessel disease.  She comes in today for a scheduled follow up.  She states she feels she is doing relatively well.  Seeing Dr Neale Burly now for [facility].  Counts stable recently.  On hydrea.  Breathing stable.  No chest pain or tightness.  Eating and drinking well.  No bowel change.     Past Medical History  Diagnosis Date  . Hypertension   . Pure hypercholesterolemia   . Osteoarthritis   . History of degenerative disc disease   . History of Clostridium difficile infection     After antibiotics of a dental procedure  . Coronary artery disease     Status post heart cath with two insignificant vessel blockages, patient chose medical management  . Polycythemia vera      [facility] (Hydrea)    Current Outpatient Prescriptions on File Prior to Visit  Medication Sig Dispense Refill  . amLODipine (NORVASC) 10 MG tablet TAKE 1 TABLET DAILY  30 tablet  0  . aspirin 81 MG tablet Take 81 mg by mouth daily.      Marland Kitchen atorvastatin (LIPITOR) 40 MG tablet Take 1 tablet (40 mg total) by mouth daily.  30 tablet  1  . fexofenadine (ALLEGRA) 180 MG tablet Take 180 mg by mouth daily.      . fluticasone (FLONASE) 50 MCG/ACT nasal spray Place 2 sprays into the nose daily.      . hydrochlorothiazide (HYDRODIURIL) 25 MG tablet Take 1 tablet (25 mg total) by mouth daily.  90 tablet  0  . hydroxyurea (HYDREA) 500 MG capsule 500 mg. Dosed by Dr Neale Burly      . lisinopril (PRINIVIL,ZESTRIL) 40 MG tablet Take 1 tablet (40 mg total) by mouth daily.  30 tablet  2  . Multiple Vitamin (MULTIVITAMIN) tablet Take 1 tablet by mouth daily.      . sertraline (ZOLOFT) 50 MG tablet Take 1 tablet (50 mg total) by mouth daily.  30 tablet  2  . amLODipine (NORVASC) 10 MG tablet Take 10 mg  by mouth daily.      Marland Kitchen azelastine (ASTELIN) 137 MCG/SPRAY nasal spray Place 1 spray into the nose 2 (two) times daily. Use in each nostril as directed       No current facility-administered medications on file prior to visit.    Review of Systems Patient denies any headache, lightheadedness or dizziness.  No significant sinus or allergy symptoms.  No chest pain, tightness or palpitations.  No increased shortness of breath, cough or congestion.  No nausea or vomiting.  No abdominal pain or cramping.  No acid reflux.  No bowel change, such as diarrhea, constipation, BRBPR or melana.  No urine change.        Objective:   Physical Exam Filed Vitals:   06/10/12 1333  BP: 130/60  Pulse: 86  Temp: 97.9 F (36.6 C)   Pulse recheck 24  79 year old female in no acute distress.   HEENT:  Nares- clear.  Oropharynx - without lesions. NECK:  Supple.  Nontender.  No audible bruit.  HEART:  Appears to be regular. LUNGS:  No crackles or wheezing audible.  Respirations even and unlabored.  RADIAL PULSE:  Equal bilaterally.   ABDOMEN:  Soft, nontender.  Bowel sounds  present and normal.  No audible abdominal bruit.   EXTREMITIES:  No increased edema present.  DP pulses palpable and equal bilaterally.         Assessment & Plan:  PULMONARY.  Previously saw Dr Meredeth Ide.  She feels her breathing is stable.  CXR 12/27/10 revealed no acute cardiopulmonary disease.  Follow.  FATIGUE.  Check met c and tsh.     HEALTH MAINTENANCE.  Declines breast and pelvic exams.  Is s/p hysterectomy.  Colonoscopy 3/04 revealed diverticulosis and internal hemorrhoids.  Declines further GI w/up.  Normal bone density 05/05/08.  Mammogram 07/06/08 - BiRADS II.  Declines further mammograms.

## 2012-06-12 NOTE — Assessment & Plan Note (Signed)
On lipitor.  Low cholesterol diet and exercise.  Check lipid panel and liver function.   

## 2012-06-12 NOTE — Assessment & Plan Note (Signed)
Now seeing Dr Neale Burly.  Following her cbc's.  Apparently stable recently.  On hydrea.  Obtain records.

## 2012-06-12 NOTE — Assessment & Plan Note (Signed)
Colonoscopy 3/04 revealed diverticulosis and internal hemorrhoids.  Currently asymptomatic.  Follow.  Bowels stable.   

## 2012-06-12 NOTE — Assessment & Plan Note (Signed)
Blood pressure has been doing well.  Check metabolic panel.  Same medication regimen.   

## 2012-06-26 ENCOUNTER — Other Ambulatory Visit: Payer: 59

## 2012-06-26 LAB — CBC CANCER CENTER
Eosinophil #: 0.2 x10 3/mm (ref 0.0–0.7)
Eosinophil %: 2.3 %
HCT: 43.5 % (ref 35.0–47.0)
Lymphocyte #: 1.9 x10 3/mm (ref 1.0–3.6)
Lymphocyte %: 22.4 %
MCH: 36.2 pg — ABNORMAL HIGH (ref 26.0–34.0)
MCHC: 33 g/dL (ref 32.0–36.0)
MCV: 110 fL — ABNORMAL HIGH (ref 80–100)
Monocyte #: 0.6 x10 3/mm (ref 0.2–0.9)
Monocyte %: 7.3 %
Neutrophil #: 5.6 x10 3/mm (ref 1.4–6.5)
Neutrophil %: 66.6 %
Platelet: 264 x10 3/mm (ref 150–440)
RDW: 14.3 % (ref 11.5–14.5)
WBC: 8.4 x10 3/mm (ref 3.6–11.0)

## 2012-06-28 ENCOUNTER — Ambulatory Visit: Payer: Self-pay | Admitting: Internal Medicine

## 2012-07-02 ENCOUNTER — Other Ambulatory Visit (INDEPENDENT_AMBULATORY_CARE_PROVIDER_SITE_OTHER): Payer: 59

## 2012-07-02 DIAGNOSIS — I10 Essential (primary) hypertension: Secondary | ICD-10-CM

## 2012-07-02 LAB — HEPATIC FUNCTION PANEL
AST: 23 U/L (ref 0–37)
Bilirubin, Direct: 0.2 mg/dL (ref 0.0–0.3)
Total Bilirubin: 1.1 mg/dL (ref 0.3–1.2)

## 2012-07-02 LAB — BASIC METABOLIC PANEL
BUN: 24 mg/dL — ABNORMAL HIGH (ref 6–23)
CO2: 28 mEq/L (ref 19–32)
Chloride: 104 mEq/L (ref 96–112)
Creatinine, Ser: 1 mg/dL (ref 0.4–1.2)
GFR: 52.97 mL/min — ABNORMAL LOW (ref >60.00)
Glucose, Bld: 96 mg/dL (ref 70–99)
Potassium: 4.1 mEq/L (ref 3.5–5.1)
Sodium: 140 mEq/L (ref 135–145)

## 2012-07-02 LAB — LIPID PANEL
HDL: 38.8 mg/dL — ABNORMAL LOW (ref >39.00)
LDL Cholesterol: 55 mg/dL (ref 0–99)
Total CHOL/HDL Ratio: 3
VLDL: 22 mg/dL (ref 0.0–40.0)

## 2012-07-08 ENCOUNTER — Telehealth: Payer: Self-pay | Admitting: Internal Medicine

## 2012-07-08 MED ORDER — LISINOPRIL 40 MG PO TABS: 40.0000 mg | ORAL_TABLET | Freq: Every day | ORAL | Status: DC

## 2012-07-08 NOTE — Telephone Encounter (Signed)
Sent in to pharmacy.  

## 2012-07-08 NOTE — Telephone Encounter (Signed)
Pt needing refill on lisinopril. States she has had no luck at the pharmacy [facility].  Pt has some amlodipine left but wants to be able to get these at the same time.

## 2012-07-31 ENCOUNTER — Ambulatory Visit: Payer: Self-pay | Admitting: Internal Medicine

## 2012-08-07 NOTE — Telephone Encounter (Signed)
Please close encounter

## 2012-08-20 LAB — CBC CANCER CENTER
Basophil #: 0.1 x10 3/mm (ref 0.0–0.1)
Basophil %: 1.5 %
Eosinophil %: 1.8 %
HCT: 42.3 % (ref 35.0–47.0)
HGB: 13.8 g/dL (ref 12.0–16.0)
Lymphocyte #: 2 x10 3/mm (ref 1.0–3.6)
Lymphocyte %: 24.2 %
MCH: 35.6 pg — ABNORMAL HIGH (ref 26.0–34.0)
MCHC: 32.7 g/dL (ref 32.0–36.0)
Monocyte #: 1 x10 3/mm — ABNORMAL HIGH (ref 0.2–0.9)
Neutrophil #: 5 x10 3/mm (ref 1.4–6.5)
Platelet: 228 x10 3/mm (ref 150–440)
RBC: 3.88 10*6/uL (ref 3.80–5.20)
RDW: 15.2 % — ABNORMAL HIGH (ref 11.5–14.5)
WBC: 8.3 x10 3/mm (ref 3.6–11.0)

## 2012-08-28 ENCOUNTER — Ambulatory Visit: Payer: Self-pay | Admitting: Internal Medicine

## 2012-09-28 ENCOUNTER — Ambulatory Visit: Payer: Self-pay | Admitting: Oncology

## 2012-10-01 LAB — CBC CANCER CENTER
Basophil #: 0.1 x10 3/mm (ref 0.0–0.1)
Basophil %: 1.3 %
Eosinophil #: 0.2 x10 3/mm (ref 0.0–0.7)
Eosinophil %: 2.7 %
HGB: 13.6 g/dL (ref 12.0–16.0)
Lymphocyte #: 1.9 x10 3/mm (ref 1.0–3.6)
Lymphocyte %: 26.3 %
MCHC: 33.8 g/dL (ref 32.0–36.0)
Monocyte #: 0.9 x10 3/mm (ref 0.2–0.9)
Monocyte %: 12.1 %
Neutrophil #: 4.2 x10 3/mm (ref 1.4–6.5)
Neutrophil %: 57.6 %
Platelet: 270 x10 3/mm (ref 150–440)
RBC: 3.67 10*6/uL — ABNORMAL LOW (ref 3.80–5.20)

## 2012-10-09 ENCOUNTER — Encounter: Payer: 59 | Admitting: Internal Medicine

## 2012-10-28 ENCOUNTER — Ambulatory Visit: Payer: Self-pay | Admitting: Oncology

## 2012-11-10 ENCOUNTER — Other Ambulatory Visit: Payer: Self-pay | Admitting: Internal Medicine

## 2012-11-10 NOTE — Telephone Encounter (Signed)
Refilled atorvastatin #30 with 3 refills.

## 2012-11-12 LAB — CBC CANCER CENTER
Basophil #: 0 x10 3/mm (ref 0.0–0.1)
Basophil %: 0.3 %
Eosinophil #: 0.2 x10 3/mm (ref 0.0–0.7)
Eosinophil %: 2.4 %
HGB: 13.5 g/dL (ref 12.0–16.0)
Lymphocyte #: 2.1 x10 3/mm (ref 1.0–3.6)
Lymphocyte %: 28.1 %
MCHC: 34.6 g/dL (ref 32.0–36.0)
MCV: 112 fL — ABNORMAL HIGH (ref 80–100)
Monocyte %: 9.2 %
Neutrophil #: 4.5 x10 3/mm (ref 1.4–6.5)
Neutrophil %: 60 %
Platelet: 297 x10 3/mm (ref 150–440)
RDW: 14.8 % — ABNORMAL HIGH (ref 11.5–14.5)

## 2012-11-28 ENCOUNTER — Ambulatory Visit: Payer: Self-pay | Admitting: Oncology

## 2012-12-15 ENCOUNTER — Ambulatory Visit (INDEPENDENT_AMBULATORY_CARE_PROVIDER_SITE_OTHER): Payer: 59 | Admitting: Internal Medicine

## 2012-12-15 ENCOUNTER — Encounter: Payer: Self-pay | Admitting: Internal Medicine

## 2012-12-15 VITALS — BP 120/80 | HR 79 | Temp 98.0°F | Ht 64.5 in | Wt 165.2 lb

## 2012-12-15 DIAGNOSIS — M129 Arthropathy, unspecified: Secondary | ICD-10-CM

## 2012-12-15 DIAGNOSIS — M1990 Unspecified osteoarthritis, unspecified site: Secondary | ICD-10-CM

## 2012-12-15 DIAGNOSIS — F329 Major depressive disorder, single episode, unspecified: Secondary | ICD-10-CM

## 2012-12-15 DIAGNOSIS — K5730 Diverticulosis of large intestine without perforation or abscess without bleeding: Secondary | ICD-10-CM

## 2012-12-15 DIAGNOSIS — D45 Polycythemia vera: Secondary | ICD-10-CM

## 2012-12-15 DIAGNOSIS — Z9109 Other allergy status, other than to drugs and biological substances: Secondary | ICD-10-CM

## 2012-12-15 DIAGNOSIS — K5790 Diverticulosis of intestine, part unspecified, without perforation or abscess without bleeding: Secondary | ICD-10-CM

## 2012-12-15 DIAGNOSIS — I10 Essential (primary) hypertension: Secondary | ICD-10-CM

## 2012-12-15 DIAGNOSIS — E7800 Pure hypercholesterolemia, unspecified: Secondary | ICD-10-CM

## 2012-12-15 NOTE — Progress Notes (Signed)
Subjective:    Patient ID: Tina Bond, female    DOB: [birth year], 79 y.o.   MRN: [id number]  HPI 79 year old female with past history of hypertension, hypercholesterolemia, osteoarthritis, polycythemia vera and CAD s/p heart cath revealing insignificant two vessel disease.  She comes in today to follow up on these issues as well as for a complete physical exam.  She states she feels she is doing relatively well.  Seeing Dr Neale Burly now for [facility].  Counts stable recently.  On hydrea.  Breathing stable.  No chest pain or tightness.  Eating and drinking well.  No bowel change.  She does report noticing some nasal congestion and eyes watering.  Some itchy eyes at times.  No chest congestion.  No increased cough.  She also reports some problems with arthritis.  Feels stiff - all over.  Taking tylenol prn.  Helps.  Moving and exercising helps.       Past Medical History  Diagnosis Date  . Hypertension   . Pure hypercholesterolemia   . Osteoarthritis   . History of degenerative disc disease   . History of Clostridium difficile infection     After antibiotics of a dental procedure  . Coronary artery disease     Status post heart cath with two insignificant vessel blockages, patient chose medical management  . Polycythemia vera(238.4)      [facility] (Hydrea)    Current Outpatient Prescriptions on File Prior to Visit  Medication Sig Dispense Refill  . acetaminophen (TYLENOL) 500 MG tablet Take 500 mg by mouth as needed for pain.      Marland Kitchen amLODipine (NORVASC) 10 MG tablet Take 1 tablet (10 mg total) by mouth daily.  90 tablet  3  . aspirin 81 MG tablet Take 81 mg by mouth daily.      Marland Kitchen atorvastatin (LIPITOR) 40 MG tablet TAKE 1 TABLET DAILY  30 tablet  3  . azelastine (ASTELIN) 137 MCG/SPRAY nasal spray Place 1 spray into the nose 2 (two) times daily. Use in each nostril as directed      . fexofenadine (ALLEGRA) 180 MG tablet Take 180 mg by mouth daily.      . fluticasone (FLONASE) 50 MCG/ACT nasal spray  Place 2 sprays into the nose daily.      . hydrochlorothiazide (HYDRODIURIL) 25 MG tablet Take 1 tablet (25 mg total) by mouth daily.  90 tablet  3  . hydroxyurea (HYDREA) 500 MG capsule 500 mg. Dosed by Dr Neale Burly      . lisinopril (PRINIVIL,ZESTRIL) 40 MG tablet Take 1 tablet (40 mg total) by mouth daily.  90 tablet  3  . Multiple Vitamin (MULTIVITAMIN) tablet Take 1 tablet by mouth daily.       No current facility-administered medications on file prior to visit.    Review of Systems Patient denies any headache, lightheadedness or dizziness.  Some nasal congestion and itchy/watery eyes as outlined.  No chest pain, tightness or palpitations.  No increased shortness of breath, cough or congestion.  No significant nausea or vomiting.  No abdominal pain or cramping. No acid reflux.  No bowel change, such as diarrhea, constipation, BRBPR or melana.  No urine change.  Joint stiffness as outlined.         Objective:   Physical Exam  Filed Vitals:   12/15/12 0835  BP: 120/80  Pulse: 79  Temp: 98 F (36.7 C)   Pulse recheck 88, blood pressure recheck:  68/65  79 year old female in  no acute distress.   HEENT:  Nares- clear.  Oropharynx - without lesions. NECK:  Supple.  Nontender.  No audible bruit.  HEART:  Appears to be regular. LUNGS:  No crackles or wheezing audible.  Respirations even and unlabored.  RADIAL PULSE:  Equal bilaterally.    BREASTS:  She declined.  ABDOMEN:  Soft, nontender.  Bowel sounds present and normal.  No audible abdominal bruit.  GU:  She declined.   EXTREMITIES:  No increased edema present.  DP pulses palpable and equal bilaterally.          Assessment & Plan:  PULMONARY.  Previously saw Dr Meredeth Ide.  She feels her breathing is stable.  CXR 12/27/10 revealed no acute cardiopulmonary disease.  Follow.    CERUMEN IMPACTION.  Left ear.  Debrox as directed.  Return for ear irrigation.    HEALTH MAINTENANCE.  Declines breast and pelvic exams.  Is s/p  hysterectomy.  Colonoscopy 3/04 revealed diverticulosis and internal hemorrhoids.  Declines further GI w/up.  Normal bone density 05/05/08.  Mammogram 07/06/08 - BiRADS II.  Declines further mammograms.

## 2012-12-15 NOTE — Patient Instructions (Addendum)
Can try nasacort or nasonex nasal sprays - two sprays each nostril before bed.  Flush with saline nasal spray 2-3x/day.  Let me know if any problems.

## 2012-12-16 ENCOUNTER — Encounter: Payer: Self-pay | Admitting: Internal Medicine

## 2012-12-16 DIAGNOSIS — Z9109 Other allergy status, other than to drugs and biological substances: Secondary | ICD-10-CM | POA: Insufficient documentation

## 2012-12-16 DIAGNOSIS — M1990 Unspecified osteoarthritis, unspecified site: Secondary | ICD-10-CM | POA: Insufficient documentation

## 2012-12-16 NOTE — Assessment & Plan Note (Signed)
On lipitor.  Low cholesterol diet and exercise.  Check lipid panel and liver function.   

## 2012-12-16 NOTE — Assessment & Plan Note (Signed)
Colonoscopy 3/04 revealed diverticulosis and internal hemorrhoids.  Currently asymptomatic.  Follow.  Bowels stable.   

## 2012-12-16 NOTE — Assessment & Plan Note (Signed)
Joint stiffness as outlined.  Exercise.  Tylenol as directed.  Desires no further intervention at this time.  Follow.   

## 2012-12-16 NOTE — Assessment & Plan Note (Signed)
Continue allegra.  Steroid nasal spray and saline as directed.  Notify me if persistent problems.

## 2012-12-16 NOTE — Assessment & Plan Note (Signed)
On zoloft and doing well.  Follow.   

## 2012-12-16 NOTE — Assessment & Plan Note (Addendum)
Now seeing Dr Neale Burly.  Following her cbc's.  Apparently stable recently.  On hydrea.

## 2012-12-16 NOTE — Assessment & Plan Note (Signed)
Blood pressure has been doing well.  Check metabolic panel.  Same medication regimen.   

## 2012-12-19 ENCOUNTER — Ambulatory Visit: Payer: 59 | Admitting: *Deleted

## 2012-12-19 DIAGNOSIS — H6122 Impacted cerumen, left ear: Secondary | ICD-10-CM

## 2012-12-19 NOTE — Progress Notes (Signed)
Pt presented to office for left ear lavage, performed per Henrene Pastor, LPN without difficulty. Pt tolerated procedure without difficulty.

## 2012-12-24 LAB — CBC CANCER CENTER
Basophil #: 0.2 x10 3/mm — ABNORMAL HIGH (ref 0.0–0.1)
Basophil %: 2.3 %
Eosinophil #: 0.1 x10 3/mm (ref 0.0–0.7)
Eosinophil %: 2.1 %
HCT: 40.4 % (ref 35.0–47.0)
Lymphocyte #: 1.8 x10 3/mm (ref 1.0–3.6)
Lymphocyte %: 26.3 %
MCH: 39 pg — ABNORMAL HIGH (ref 26.0–34.0)
MCHC: 34.6 g/dL (ref 32.0–36.0)
MCV: 113 fL — ABNORMAL HIGH (ref 80–100)
Monocyte #: 0.6 x10 3/mm (ref 0.2–0.9)
Neutrophil #: 4.3 x10 3/mm (ref 1.4–6.5)
Neutrophil %: 61.1 %
Platelet: 297 x10 3/mm (ref 150–440)

## 2012-12-29 ENCOUNTER — Ambulatory Visit: Payer: Self-pay | Admitting: Oncology

## 2013-02-04 ENCOUNTER — Ambulatory Visit: Payer: Self-pay | Admitting: Internal Medicine

## 2013-02-04 LAB — CBC CANCER CENTER
Basophil %: 1.4 %
Eosinophil #: 0.1 x10 3/mm (ref 0.0–0.7)
HGB: 13.4 g/dL (ref 12.0–16.0)
Lymphocyte #: 1.9 x10 3/mm (ref 1.0–3.6)
MCV: 115 fL — ABNORMAL HIGH (ref 80–100)
Monocyte %: 8.3 %
Neutrophil #: 4.5 x10 3/mm (ref 1.4–6.5)
Platelet: 291 x10 3/mm (ref 150–440)
RDW: 14.2 % (ref 11.5–14.5)
WBC: 7.2 x10 3/mm (ref 3.6–11.0)

## 2013-02-28 ENCOUNTER — Ambulatory Visit: Payer: Self-pay | Admitting: Internal Medicine

## 2013-03-31 ENCOUNTER — Other Ambulatory Visit: Payer: Self-pay | Admitting: Internal Medicine

## 2013-03-31 MED ORDER — LISINOPRIL 40 MG PO TABS: 40.0000 mg | ORAL_TABLET | Freq: Every day | ORAL | Status: DC

## 2013-03-31 NOTE — Telephone Encounter (Signed)
Lisinopril sent to [facility]. Pt is due for fasting labs, orders have already been placed. Called pt to notify of need for labs, no answer and no VM. Will call back.

## 2013-03-31 NOTE — Telephone Encounter (Signed)
Lisinopril 30 day supply needed to be sent to local pharmacy = [facility].  States usually go to scripts but they have not received so she needs immediately until she can get from [facility].  Cholesterol medication atorvastatin also needed to be sent to scripts.  States she was told to get a 90 day supply.

## 2013-04-01 MED ORDER — ATORVASTATIN CALCIUM 40 MG PO TABS: ORAL_TABLET | ORAL | Status: DC

## 2013-04-01 NOTE — Telephone Encounter (Signed)
Spoke with pt, advised on need for fasting labs. Pt unable to schedule appointment while on phone, requested to call back and schedule.

## 2013-04-08 ENCOUNTER — Other Ambulatory Visit: Payer: Self-pay | Admitting: *Deleted

## 2013-04-08 ENCOUNTER — Other Ambulatory Visit (INDEPENDENT_AMBULATORY_CARE_PROVIDER_SITE_OTHER): Payer: 59

## 2013-04-08 ENCOUNTER — Ambulatory Visit: Payer: Self-pay | Admitting: Internal Medicine

## 2013-04-08 DIAGNOSIS — M1990 Unspecified osteoarthritis, unspecified site: Secondary | ICD-10-CM

## 2013-04-08 DIAGNOSIS — I10 Essential (primary) hypertension: Secondary | ICD-10-CM

## 2013-04-08 DIAGNOSIS — E7800 Pure hypercholesterolemia, unspecified: Secondary | ICD-10-CM

## 2013-04-08 LAB — BASIC METABOLIC PANEL
BUN: 22 mg/dL (ref 6–23)
CO2: 27 mEq/L (ref 19–32)
Calcium: 9.5 mg/dL (ref 8.4–10.5)
Creatinine, Ser: 1.1 mg/dL (ref 0.4–1.2)
GFR: 50.62 mL/min — ABNORMAL LOW (ref >60.00)
Glucose, Bld: 97 mg/dL (ref 70–99)

## 2013-04-08 LAB — HEPATIC FUNCTION PANEL
ALT: 19 U/L (ref 0–35)
Albumin: 4.3 g/dL (ref 3.5–5.2)
Alkaline Phosphatase: 87 U/L (ref 39–117)

## 2013-04-08 LAB — CBC CANCER CENTER
Basophil #: 0.1 x10 3/mm (ref 0.0–0.1)
Eosinophil %: 2.5 %
HGB: 13.2 g/dL (ref 12.0–16.0)
Lymphocyte #: 1.8 x10 3/mm (ref 1.0–3.6)
Lymphocyte %: 29.2 %
MCHC: 32.4 g/dL (ref 32.0–36.0)
MCV: 117 fL — ABNORMAL HIGH (ref 80–100)
Monocyte #: 0.7 x10 3/mm (ref 0.2–0.9)
Neutrophil #: 3.3 x10 3/mm (ref 1.4–6.5)
Platelet: 270 x10 3/mm (ref 150–440)
RBC: 3.49 10*6/uL — ABNORMAL LOW (ref 3.80–5.20)

## 2013-04-08 LAB — LIPID PANEL
HDL: 40.2 mg/dL (ref >39.00)
LDL Cholesterol: 60 mg/dL (ref 0–99)
VLDL: 28.4 mg/dL (ref 0.0–40.0)

## 2013-04-08 LAB — TSH: TSH: 2.27 u[IU]/mL (ref 0.35–5.50)

## 2013-04-08 MED ORDER — LISINOPRIL 40 MG PO TABS: 40.0000 mg | ORAL_TABLET | Freq: Every day | ORAL | Status: DC

## 2013-04-09 ENCOUNTER — Encounter: Payer: Self-pay | Admitting: *Deleted

## 2013-04-30 ENCOUNTER — Ambulatory Visit: Payer: Self-pay | Admitting: Internal Medicine

## 2013-05-06 LAB — CBC CANCER CENTER
BASOS PCT: 1.4 %
Basophil #: 0.1 x10 3/mm (ref 0.0–0.1)
Eosinophil #: 0.1 x10 3/mm (ref 0.0–0.7)
Eosinophil %: 1.4 %
HCT: 40.7 % (ref 35.0–47.0)
HGB: 13.5 g/dL (ref 12.0–16.0)
LYMPHS PCT: 20.2 %
Lymphocyte #: 2.1 x10 3/mm (ref 1.0–3.6)
MCH: 38.4 pg — AB (ref 26.0–34.0)
MCHC: 33.2 g/dL (ref 32.0–36.0)
MCV: 116 fL — ABNORMAL HIGH (ref 80–100)
MONO ABS: 0.9 x10 3/mm (ref 0.2–0.9)
Monocyte %: 8.7 %
Neutrophil #: 7.1 x10 3/mm — ABNORMAL HIGH (ref 1.4–6.5)
Neutrophil %: 68.3 %
PLATELETS: 344 x10 3/mm (ref 150–440)
RBC: 3.51 10*6/uL — AB (ref 3.80–5.20)
RDW: 13.6 % (ref 11.5–14.5)
WBC: 10.4 x10 3/mm (ref 3.6–11.0)

## 2013-05-17 ENCOUNTER — Other Ambulatory Visit: Payer: Self-pay | Admitting: Internal Medicine

## 2013-05-31 ENCOUNTER — Ambulatory Visit: Payer: Self-pay | Admitting: Internal Medicine

## 2013-06-08 ENCOUNTER — Other Ambulatory Visit: Payer: Self-pay | Admitting: Internal Medicine

## 2013-06-10 LAB — CBC CANCER CENTER
Basophil #: 0.1 x10 3/mm (ref 0.0–0.1)
Basophil %: 1.2 %
Eosinophil #: 0.2 x10 3/mm (ref 0.0–0.7)
Eosinophil %: 3 %
HCT: 40.6 % (ref 35.0–47.0)
HGB: 13.4 g/dL (ref 12.0–16.0)
LYMPHS ABS: 1.8 x10 3/mm (ref 1.0–3.6)
LYMPHS PCT: 24.7 %
MCH: 37.7 pg — ABNORMAL HIGH (ref 26.0–34.0)
MCHC: 33 g/dL (ref 32.0–36.0)
MCV: 114 fL — AB (ref 80–100)
MONOS PCT: 8 %
Monocyte #: 0.6 x10 3/mm (ref 0.2–0.9)
Neutrophil #: 4.6 x10 3/mm (ref 1.4–6.5)
Neutrophil %: 63.1 %
Platelet: 257 x10 3/mm (ref 150–440)
RBC: 3.56 10*6/uL — ABNORMAL LOW (ref 3.80–5.20)
RDW: 13.4 % (ref 11.5–14.5)
WBC: 7.3 x10 3/mm (ref 3.6–11.0)

## 2013-06-17 ENCOUNTER — Ambulatory Visit: Payer: 59 | Admitting: Internal Medicine

## 2013-06-18 ENCOUNTER — Ambulatory Visit (INDEPENDENT_AMBULATORY_CARE_PROVIDER_SITE_OTHER): Payer: 59 | Admitting: Internal Medicine

## 2013-06-18 ENCOUNTER — Encounter: Payer: Self-pay | Admitting: Internal Medicine

## 2013-06-18 VITALS — BP 140/70 | HR 68 | Temp 97.6°F | Ht 64.5 in | Wt 166.5 lb

## 2013-06-18 DIAGNOSIS — F3289 Other specified depressive episodes: Secondary | ICD-10-CM

## 2013-06-18 DIAGNOSIS — F329 Major depressive disorder, single episode, unspecified: Secondary | ICD-10-CM

## 2013-06-18 DIAGNOSIS — M129 Arthropathy, unspecified: Secondary | ICD-10-CM

## 2013-06-18 DIAGNOSIS — K5730 Diverticulosis of large intestine without perforation or abscess without bleeding: Secondary | ICD-10-CM

## 2013-06-18 DIAGNOSIS — M1990 Unspecified osteoarthritis, unspecified site: Secondary | ICD-10-CM

## 2013-06-18 DIAGNOSIS — F32A Depression, unspecified: Secondary | ICD-10-CM

## 2013-06-18 DIAGNOSIS — K5790 Diverticulosis of intestine, part unspecified, without perforation or abscess without bleeding: Secondary | ICD-10-CM

## 2013-06-18 DIAGNOSIS — Z9109 Other allergy status, other than to drugs and biological substances: Secondary | ICD-10-CM

## 2013-06-18 DIAGNOSIS — E7800 Pure hypercholesterolemia, unspecified: Secondary | ICD-10-CM

## 2013-06-18 DIAGNOSIS — D45 Polycythemia vera: Secondary | ICD-10-CM

## 2013-06-18 DIAGNOSIS — I10 Essential (primary) hypertension: Secondary | ICD-10-CM

## 2013-06-18 DIAGNOSIS — Z23 Encounter for immunization: Secondary | ICD-10-CM

## 2013-06-18 NOTE — Assessment & Plan Note (Addendum)
On lipitor.  Low cholesterol diet and exercise.  Check lipid panel and liver function.   

## 2013-06-18 NOTE — Progress Notes (Signed)
Pre-visit discussion using our clinic review tool. No additional management support is needed unless otherwise documented below in the visit note.  

## 2013-06-18 NOTE — Progress Notes (Signed)
Subjective:    Patient ID: Tina Bond, female    DOB: Apr 08, 1921, 80 y.o.   MRN: [id number]  HPI 80 year old female with past history of hypertension, hypercholesterolemia, osteoarthritis, polycythemia vera and CAD s/p heart cath revealing insignificant two vessel disease.  She comes in today for a scheduled follow up.   She states she feels she is doing relatively well.  Seeing Dr Cynda Acres for [facility].  Counts have been stable.  Just checked last week.   On hydrea.  Breathing stable.  No chest pain or tightness.  Eating and drinking well.  No bowel change.    No chest congestion.  No increased cough.  She also reports some problems with arthritis.  Feels stiff - all over.  Taking tylenol prn.  Helps.  Moving and exercising helps.  Previous cough, better.       Past Medical History  Diagnosis Date  . Hypertension   . Pure hypercholesterolemia   . Osteoarthritis   . History of degenerative disc disease   . History of Clostridium difficile infection     After antibiotics of a dental procedure  . Coronary artery disease     Status post heart cath with two insignificant vessel blockages, patient chose medical management  . Polycythemia vera(238.4)      [place] (Hydrea)    Current Outpatient Prescriptions on File Prior to Visit  Medication Sig Dispense Refill  . acetaminophen (TYLENOL) 500 MG tablet Take 500 mg by mouth as needed for pain.      Marland Kitchen amLODipine (NORVASC) 10 MG tablet TAKE 1 TABLET DAILY  90 tablet  1  . aspirin 81 MG tablet Take 81 mg by mouth daily.      Marland Kitchen atorvastatin (LIPITOR) 40 MG tablet TAKE 1 TABLET DAILY  90 tablet  0  . azelastine (ASTELIN) 137 MCG/SPRAY nasal spray Place 1 spray into the nose 2 (two) times daily. Use in each nostril as directed      . fexofenadine (ALLEGRA) 180 MG tablet Take 180 mg by mouth daily.      . fluticasone (FLONASE) 50 MCG/ACT nasal spray Place 2 sprays into the nose daily.      . hydrochlorothiazide (HYDRODIURIL) 25 MG tablet Take 1 tablet (25 mg  total) by mouth daily.  90 tablet  3  . hydroxyurea (HYDREA) 500 MG capsule 500 mg. Dosed by Dr Cynda Acres      . lisinopril (PRINIVIL,ZESTRIL) 40 MG tablet Take 1 tablet (40 mg total) by mouth daily.  90 tablet  1  . Multiple Vitamin (MULTIVITAMIN) tablet Take 1 tablet by mouth daily.       No current facility-administered medications on file prior to visit.    Review of Systems Patient denies any headache, lightheadedness or dizziness.  Previous cough.  Better now.  No chest pain, tightness or palpitations.  No increased shortness of breath or increased congestion.  No significant nausea or vomiting.  No abdominal pain or cramping. No acid reflux.  No bowel change, such as diarrhea, constipation, BRBPR or melana.  No urine change.  Joint stiffness as outlined.         Objective:   Physical Exam  Filed Vitals:   06/18/13 1053  BP: 140/70  Pulse: 68  Temp: 97.6 F (36.4 C)   Pulse recheck 88, blood pressure recheck:  140/78, pulaw 54  80 year old female in no acute distress.   HEENT:  Nares- clear.  Oropharynx - without lesions. NECK:  Supple.  Nontender.  No audible bruit.  HEART:  Appears to be regular. LUNGS:  No crackles or wheezing audible.  Respirations even and unlabored.  RADIAL PULSE:  Equal bilaterally.  ABDOMEN:  Soft, nontender.  Bowel sounds present and normal.  No audible abdominal bruit.   EXTREMITIES:  No increased edema present.  DP pulses palpable and equal bilaterally.          Assessment & Plan:  PULMONARY.  Previously saw Dr Raul Del.  She feels her breathing is stable.  CXR 12/27/10 revealed no acute cardiopulmonary disease.  Follow.    HEALTH MAINTENANCE.  Declines breast and pelvic exams.  Is s/p hysterectomy.  Colonoscopy 3/04 revealed diverticulosis and internal hemorrhoids.  Declines further GI w/up.  Normal bone density 05/05/08.  Mammogram 07/06/08 - BiRADS II.  Declines further mammograms.

## 2013-06-18 NOTE — Assessment & Plan Note (Addendum)
Now seeing Dr Cynda Acres.  Following her cbc's.  Labs just checked and  Stable.  On hydrea.

## 2013-06-21 ENCOUNTER — Encounter: Payer: Self-pay | Admitting: Internal Medicine

## 2013-06-21 ENCOUNTER — Other Ambulatory Visit: Payer: Self-pay | Admitting: Internal Medicine

## 2013-06-21 DIAGNOSIS — L989 Disorder of the skin and subcutaneous tissue, unspecified: Secondary | ICD-10-CM

## 2013-06-21 NOTE — Progress Notes (Signed)
Order placed for dermatology referral.  

## 2013-06-21 NOTE — Assessment & Plan Note (Signed)
On zoloft and doing well.  Follow.   

## 2013-06-21 NOTE — Assessment & Plan Note (Signed)
Joint stiffness as outlined.  Exercise.  Tylenol as directed.  Desires no further intervention at this time.  Follow.   

## 2013-06-21 NOTE — Assessment & Plan Note (Signed)
Colonoscopy 3/04 revealed diverticulosis and internal hemorrhoids.  Currently asymptomatic.  Follow.  Bowels stable.   

## 2013-06-21 NOTE — Assessment & Plan Note (Signed)
Continue allegra.  Steroid nasal spray and saline.  Stable.  Doing well.    

## 2013-06-21 NOTE — Assessment & Plan Note (Signed)
Blood pressure has been doing well.  Check metabolic panel.  Same medication regimen.

## 2013-06-28 ENCOUNTER — Ambulatory Visit: Payer: Self-pay | Admitting: Internal Medicine

## 2013-08-02 ENCOUNTER — Other Ambulatory Visit: Payer: Self-pay | Admitting: Internal Medicine

## 2013-08-12 ENCOUNTER — Ambulatory Visit: Payer: Self-pay | Admitting: Internal Medicine

## 2013-08-12 LAB — CBC CANCER CENTER
BASOS ABS: 0.1 x10 3/mm (ref 0.0–0.1)
Basophil %: 1.3 %
Eosinophil #: 0.3 x10 3/mm (ref 0.0–0.7)
Eosinophil %: 2.9 %
HCT: 43.5 % (ref 35.0–47.0)
HGB: 14.1 g/dL (ref 12.0–16.0)
Lymphocyte #: 2 x10 3/mm (ref 1.0–3.6)
Lymphocyte %: 21 %
MCH: 36.3 pg — AB (ref 26.0–34.0)
MCHC: 32.4 g/dL (ref 32.0–36.0)
MCV: 112 fL — ABNORMAL HIGH (ref 80–100)
MONO ABS: 0.7 x10 3/mm (ref 0.2–0.9)
Monocyte %: 7.5 %
NEUTROS ABS: 6.4 x10 3/mm (ref 1.4–6.5)
Neutrophil %: 67.3 %
Platelet: 337 x10 3/mm (ref 150–440)
RBC: 3.88 10*6/uL (ref 3.80–5.20)
RDW: 13.5 % (ref 11.5–14.5)
WBC: 9.4 x10 3/mm (ref 3.6–11.0)

## 2013-08-28 ENCOUNTER — Ambulatory Visit: Payer: Self-pay | Admitting: Internal Medicine

## 2013-09-05 ENCOUNTER — Other Ambulatory Visit: Payer: Self-pay | Admitting: Internal Medicine

## 2013-09-23 LAB — CBC CANCER CENTER
BASOS ABS: 0.2 x10 3/mm — AB (ref 0.0–0.1)
BASOS PCT: 2.1 %
EOS PCT: 2.1 %
Eosinophil #: 0.2 x10 3/mm (ref 0.0–0.7)
HCT: 40.7 % (ref 35.0–47.0)
HGB: 13.3 g/dL (ref 12.0–16.0)
Lymphocyte #: 1.9 x10 3/mm (ref 1.0–3.6)
Lymphocyte %: 25.4 %
MCH: 36.3 pg — AB (ref 26.0–34.0)
MCHC: 32.6 g/dL (ref 32.0–36.0)
MCV: 111 fL — AB (ref 80–100)
Monocyte #: 0.6 x10 3/mm (ref 0.2–0.9)
Monocyte %: 8.4 %
NEUTROS ABS: 4.7 x10 3/mm (ref 1.4–6.5)
NEUTROS PCT: 62 %
Platelet: 286 x10 3/mm (ref 150–440)
RBC: 3.65 10*6/uL — AB (ref 3.80–5.20)
RDW: 13.8 % (ref 11.5–14.5)
WBC: 7.6 x10 3/mm (ref 3.6–11.0)

## 2013-09-28 ENCOUNTER — Ambulatory Visit: Payer: Self-pay | Admitting: Internal Medicine

## 2013-09-29 ENCOUNTER — Telehealth: Payer: Self-pay | Admitting: Internal Medicine

## 2013-10-02 ENCOUNTER — Encounter: Payer: Self-pay | Admitting: Internal Medicine

## 2013-10-08 LAB — CBC CANCER CENTER
BASOS PCT: 1.4 %
Basophil #: 0.1 x10 3/mm (ref 0.0–0.1)
EOS ABS: 0.2 x10 3/mm (ref 0.0–0.7)
EOS PCT: 2.5 %
HCT: 43.2 % (ref 35.0–47.0)
HGB: 13.9 g/dL (ref 12.0–16.0)
LYMPHS ABS: 2.4 x10 3/mm (ref 1.0–3.6)
Lymphocyte %: 28.5 %
MCH: 36.1 pg — ABNORMAL HIGH (ref 26.0–34.0)
MCHC: 32.3 g/dL (ref 32.0–36.0)
MCV: 112 fL — ABNORMAL HIGH (ref 80–100)
Monocyte #: 0.8 x10 3/mm (ref 0.2–0.9)
Monocyte %: 9.8 %
NEUTROS ABS: 5 x10 3/mm (ref 1.4–6.5)
Neutrophil %: 57.8 %
PLATELETS: 292 x10 3/mm (ref 150–440)
RBC: 3.86 10*6/uL (ref 3.80–5.20)
RDW: 14 % (ref 11.5–14.5)
WBC: 8.6 x10 3/mm (ref 3.6–11.0)

## 2013-10-22 ENCOUNTER — Other Ambulatory Visit: Payer: Self-pay | Admitting: Internal Medicine

## 2013-10-28 ENCOUNTER — Ambulatory Visit: Payer: Self-pay | Admitting: Internal Medicine

## 2013-10-28 LAB — CBC CANCER CENTER
Basophil #: 0.1 "x10 3/mm "
Basophil %: 1.3 %
Eosinophil #: 0.2 "x10 3/mm "
Eosinophil %: 2.1 %
HCT: 41.2 %
HGB: 13.5 g/dL
Lymphocyte %: 20.3 %
Lymphs Abs: 1.9 "x10 3/mm "
MCH: 36.7 pg — ABNORMAL HIGH
MCHC: 32.9 g/dL
MCV: 112 fL — ABNORMAL HIGH
Monocyte #: 0.8 "x10 3/mm "
Monocyte %: 8.5 %
Neutrophil #: 6.3 "x10 3/mm "
Neutrophil %: 67.8 %
Platelet: 305 "x10 3/mm "
RBC: 3.69 "x10 6/mm " — ABNORMAL LOW
RDW: 14.5 %
WBC: 9.3 "x10 3/mm "

## 2013-11-14 ENCOUNTER — Other Ambulatory Visit: Payer: Self-pay | Admitting: Internal Medicine

## 2013-11-18 LAB — CANCER CENTER HEMATOCRIT: HCT: 40.2 % (ref 35.0–47.0)

## 2013-11-28 ENCOUNTER — Ambulatory Visit: Payer: Self-pay | Admitting: Internal Medicine

## 2013-12-15 ENCOUNTER — Other Ambulatory Visit (INDEPENDENT_AMBULATORY_CARE_PROVIDER_SITE_OTHER): Payer: 59

## 2013-12-15 DIAGNOSIS — E7800 Pure hypercholesterolemia, unspecified: Secondary | ICD-10-CM

## 2013-12-15 DIAGNOSIS — I10 Essential (primary) hypertension: Secondary | ICD-10-CM

## 2013-12-15 LAB — BASIC METABOLIC PANEL
BUN: 20 mg/dL (ref 6–23)
CALCIUM: 9.4 mg/dL (ref 8.4–10.5)
CHLORIDE: 102 meq/L (ref 96–112)
CO2: 29 meq/L (ref 19–32)
CREATININE: 1 mg/dL (ref 0.4–1.2)
GFR: 54.61 mL/min — ABNORMAL LOW (ref >60.00)
Glucose, Bld: 80 mg/dL (ref 70–99)
Potassium: 4.4 mEq/L (ref 3.5–5.1)
Sodium: 140 mEq/L (ref 135–145)

## 2013-12-15 LAB — LIPID PANEL
Cholesterol: 117 mg/dL (ref 0–200)
HDL: 37.9 mg/dL — ABNORMAL LOW
LDL Cholesterol: 57 mg/dL (ref 0–99)
NonHDL: 79.1
Total CHOL/HDL Ratio: 3
Triglycerides: 113 mg/dL (ref 0.0–149.0)
VLDL: 22.6 mg/dL (ref 0.0–40.0)

## 2013-12-15 LAB — HEPATIC FUNCTION PANEL
ALT: 32 U/L (ref 0–35)
AST: 36 U/L (ref 0–37)
Albumin: 3.9 g/dL (ref 3.5–5.2)
Alkaline Phosphatase: 128 U/L — ABNORMAL HIGH (ref 39–117)
Bilirubin, Direct: 0.2 mg/dL (ref 0.0–0.3)
Total Bilirubin: 1.1 mg/dL (ref 0.2–1.2)
Total Protein: 7.9 g/dL (ref 6.0–8.3)

## 2013-12-16 ENCOUNTER — Other Ambulatory Visit: Payer: 59

## 2013-12-16 LAB — CBC CANCER CENTER
BASOS ABS: 0.1 x10 3/mm (ref 0.0–0.1)
BASOS PCT: 1.3 %
Eosinophil #: 0.3 x10 3/mm (ref 0.0–0.7)
Eosinophil %: 3.2 %
HCT: 41.2 % (ref 35.0–47.0)
HGB: 13.3 g/dL (ref 12.0–16.0)
LYMPHS PCT: 22.4 %
Lymphocyte #: 1.8 x10 3/mm (ref 1.0–3.6)
MCH: 35.9 pg — ABNORMAL HIGH (ref 26.0–34.0)
MCHC: 32.4 g/dL (ref 32.0–36.0)
MCV: 111 fL — ABNORMAL HIGH (ref 80–100)
Monocyte #: 0.8 x10 3/mm (ref 0.2–0.9)
Monocyte %: 9.9 %
Neutrophil #: 5.1 x10 3/mm (ref 1.4–6.5)
Neutrophil %: 63.2 %
Platelet: 295 x10 3/mm (ref 150–440)
RBC: 3.71 10*6/uL — AB (ref 3.80–5.20)
RDW: 14.6 % — AB (ref 11.5–14.5)
WBC: 8 x10 3/mm (ref 3.6–11.0)

## 2013-12-21 ENCOUNTER — Encounter: Payer: Self-pay | Admitting: Internal Medicine

## 2013-12-21 ENCOUNTER — Ambulatory Visit (INDEPENDENT_AMBULATORY_CARE_PROVIDER_SITE_OTHER): Payer: 59 | Admitting: Internal Medicine

## 2013-12-21 VITALS — BP 130/60 | HR 72 | Temp 98.0°F | Ht 64.0 in | Wt 161.0 lb

## 2013-12-21 DIAGNOSIS — H6122 Impacted cerumen, left ear: Secondary | ICD-10-CM

## 2013-12-21 DIAGNOSIS — Z9109 Other allergy status, other than to drugs and biological substances: Secondary | ICD-10-CM

## 2013-12-21 DIAGNOSIS — F329 Major depressive disorder, single episode, unspecified: Secondary | ICD-10-CM

## 2013-12-21 DIAGNOSIS — K5730 Diverticulosis of large intestine without perforation or abscess without bleeding: Secondary | ICD-10-CM

## 2013-12-21 DIAGNOSIS — M129 Arthropathy, unspecified: Secondary | ICD-10-CM

## 2013-12-21 DIAGNOSIS — E7800 Pure hypercholesterolemia, unspecified: Secondary | ICD-10-CM

## 2013-12-21 DIAGNOSIS — F3289 Other specified depressive episodes: Secondary | ICD-10-CM

## 2013-12-21 DIAGNOSIS — F32A Depression, unspecified: Secondary | ICD-10-CM

## 2013-12-21 DIAGNOSIS — R748 Abnormal levels of other serum enzymes: Secondary | ICD-10-CM | POA: Insufficient documentation

## 2013-12-21 DIAGNOSIS — H6120 Impacted cerumen, unspecified ear: Secondary | ICD-10-CM

## 2013-12-21 DIAGNOSIS — D45 Polycythemia vera: Secondary | ICD-10-CM

## 2013-12-21 DIAGNOSIS — M1990 Unspecified osteoarthritis, unspecified site: Secondary | ICD-10-CM

## 2013-12-21 DIAGNOSIS — I10 Essential (primary) hypertension: Secondary | ICD-10-CM

## 2013-12-21 NOTE — Progress Notes (Signed)
Subjective:    Patient ID: Tina Bond, female    DOB: 07/19/1920, 80 y.o.   MRN: [id number]  HPI 80 year old female with past history of hypertension, hypercholesterolemia, osteoarthritis, polycythemia vera and CAD s/p heart cath revealing insignificant two vessel disease.  She comes in today for a scheduled follow up.  Was here for her physical, but desires no breast exam, etc.   She states she feels she is doing relatively well.  Seeing Dr Cynda Acres for PV.  Counts have been stable.  On hydrea.  Last hct - 41.  Breathing stable.  No chest pain or tightness.  Eating and drinking well.  No bowel change.  No chest congestion.  No increased cough.  She also reports some problems with arthritis.  Feels stiff - all over.  Taking tylenol prn.   When has problems with increased neck stiffness, takes alleve.  Helps.  Knows not to take on a regular basis.  Moving and exercising help.  States she can't hear as well.  Was questioning cerumen impaction.  No pain.       Past Medical History  Diagnosis Date  . Hypertension   . Pure hypercholesterolemia   . Osteoarthritis   . History of degenerative disc disease   . History of Clostridium difficile infection     After antibiotics of a dental procedure  . Coronary artery disease     Status post heart cath with two insignificant vessel blockages, patient chose medical management  . Polycythemia vera(238.4)      [place] (Hydrea)    Current Outpatient Prescriptions on File Prior to Visit  Medication Sig Dispense Refill  . acetaminophen (TYLENOL) 500 MG tablet Take 500 mg by mouth as needed for pain.      Marland Kitchen amLODipine (NORVASC) 10 MG tablet TAKE 1 TABLET DAILY  90 tablet  0  . aspirin 81 MG tablet Take 81 mg by mouth daily.      Marland Kitchen atorvastatin (LIPITOR) 40 MG tablet TAKE 1 TABLET DAILY  90 tablet  1  . azelastine (ASTELIN) 137 MCG/SPRAY nasal spray Place 1 spray into the nose 2 (two) times daily. Use in each nostril as directed      . fexofenadine (ALLEGRA)  180 MG tablet Take 180 mg by mouth daily.      . fluticasone (FLONASE) 50 MCG/ACT nasal spray Place 2 sprays into the nose daily.      . hydrochlorothiazide (HYDRODIURIL) 25 MG tablet TAKE 1 TABLET DAILY  90 tablet  1  . hydroxyurea (HYDREA) 500 MG capsule 500 mg. Dosed by Dr Cynda Acres      . lisinopril (PRINIVIL,ZESTRIL) 40 MG tablet TAKE 1 TABLET DAILY  90 tablet  0  . Multiple Vitamin (MULTIVITAMIN) tablet Take 1 tablet by mouth daily.       No current facility-administered medications on file prior to visit.    Review of Systems Patient denies any headache, lightheadedness or dizziness.  No sinus or allergy symptoms.  No chest pain, tightness or palpitations.  No increased shortness of breath or increased congestion. No cough.  No nausea or vomiting.  No abdominal pain or cramping. No acid reflux.  No bowel change, such as diarrhea, constipation, BRBPR or melana.  No urine change.  Joint stiffness as outlined.  Neck pain and stiffness as outlined.         Objective:   Physical Exam  Filed Vitals:   12/21/13 1329  BP: 130/60  Pulse: 72  Temp: 98 F (  36.7 C)   Pulse recheck 88, blood pressure recheck:  130/62, pulse 78  80 year old female in no acute distress.   HEENT:  Nares- clear.  Oropharynx - without lesions.  Ear:  Left ear - cerumen impaction.  NECK:  Supple.  Nontender.  No audible bruit.  HEART:  Appears to be regular. LUNGS:  No crackles or wheezing audible.  Respirations even and unlabored.  RADIAL PULSE:  Equal bilaterally.  BREASTS:  She declined.   ABDOMEN:  Soft, nontender.  Bowel sounds present and normal.  No audible abdominal bruit.  GU:  Not performed.    EXTREMITIES:  No increased edema present.  DP pulses palpable and equal bilaterally.   SKIN:  Feel without lesions.          Assessment & Plan:  PULMONARY.  Previously saw Dr Raul Del.  She feels her breathing is stable.  CXR 12/27/10 revealed no acute cardiopulmonary disease.  Follow.    HEALTH MAINTENANCE.   Declines breast and pelvic exams.  Is s/p hysterectomy.  Colonoscopy 3/04 revealed diverticulosis and internal hemorrhoids.  Declines further GI w/up.  Normal bone density 05/05/08.  Mammogram 07/06/08 - BiRADS II.  Declines further mammograms.   I spent 25 minutes with the patient and more than 50% of the time was spent in consultation regarding the above.

## 2013-12-21 NOTE — Progress Notes (Signed)
Pre visit review using our clinic review tool, if applicable. No additional management support is needed unless otherwise documented below in the visit note. 

## 2013-12-23 ENCOUNTER — Encounter: Payer: Self-pay | Admitting: Internal Medicine

## 2013-12-23 DIAGNOSIS — H6120 Impacted cerumen, unspecified ear: Secondary | ICD-10-CM | POA: Insufficient documentation

## 2013-12-23 NOTE — Assessment & Plan Note (Signed)
Slight elevation noted on 12/15/13 labs.  Recheck in a few weeks.

## 2013-12-23 NOTE — Assessment & Plan Note (Signed)
Blood pressure has been doing well.  Follow metabolic panel.  Same medication regimen.

## 2013-12-23 NOTE — Assessment & Plan Note (Signed)
Now seeing Dr Cynda Acres.  Following her cbc's.  Labs just checked and  Stable.  On hydrea.  Sees him every 4-6 weeks.

## 2013-12-23 NOTE — Assessment & Plan Note (Signed)
Joint stiffness as outlined.  Exercise.  Tylenol as directed.  Desires no further intervention at this time.  Follow.

## 2013-12-23 NOTE — Assessment & Plan Note (Signed)
On lipitor.  Low cholesterol diet and exercise.  Follow lipid panel and liver function.  Recent LDL - 57.

## 2013-12-23 NOTE — Assessment & Plan Note (Signed)
On zoloft and doing well.  Follow.   

## 2013-12-23 NOTE — Assessment & Plan Note (Signed)
Continue allegra.  Steroid nasal spray and saline.  Stable.  Doing well.

## 2013-12-23 NOTE — Assessment & Plan Note (Signed)
Cerumen impaction - left ear.  Debrox given and instructed on proper way to use.  Return for ear irrigation.

## 2013-12-23 NOTE — Assessment & Plan Note (Signed)
Colonoscopy 3/04 revealed diverticulosis and internal hemorrhoids.  Currently asymptomatic.  Follow.  Bowels stable.

## 2013-12-29 ENCOUNTER — Ambulatory Visit: Payer: Self-pay | Admitting: Internal Medicine

## 2013-12-29 ENCOUNTER — Ambulatory Visit: Payer: Medicare Other | Admitting: *Deleted

## 2013-12-29 DIAGNOSIS — H6122 Impacted cerumen, left ear: Secondary | ICD-10-CM

## 2013-12-29 NOTE — Progress Notes (Signed)
Patient left ear lavaged for ear wax impaction. Patient tolerated well.

## 2014-01-07 ENCOUNTER — Other Ambulatory Visit: Payer: Self-pay | Admitting: Internal Medicine

## 2014-01-11 ENCOUNTER — Other Ambulatory Visit: Payer: 59

## 2014-01-12 ENCOUNTER — Other Ambulatory Visit (INDEPENDENT_AMBULATORY_CARE_PROVIDER_SITE_OTHER): Payer: 59

## 2014-01-12 ENCOUNTER — Encounter: Payer: Self-pay | Admitting: Adult Health

## 2014-01-12 ENCOUNTER — Ambulatory Visit (INDEPENDENT_AMBULATORY_CARE_PROVIDER_SITE_OTHER): Payer: Medicare Other | Admitting: Adult Health

## 2014-01-12 DIAGNOSIS — R748 Abnormal levels of other serum enzymes: Secondary | ICD-10-CM

## 2014-01-12 DIAGNOSIS — Z0000 Encounter for general adult medical examination without abnormal findings: Secondary | ICD-10-CM

## 2014-01-12 DIAGNOSIS — Z23 Encounter for immunization: Secondary | ICD-10-CM

## 2014-01-12 LAB — ALKALINE PHOSPHATASE: Alkaline Phosphatase: 106 U/L (ref 39–117)

## 2014-01-12 NOTE — Progress Notes (Signed)
Pre visit review using our clinic review tool, if applicable. No additional management support is needed unless otherwise documented below in the visit note. 

## 2014-01-12 NOTE — Patient Instructions (Signed)
  You had your Medicare Wellness Screen today.  You received your Flu vaccine in clinic today.  Information regarding the shingles vaccine has been provided. If you decide you want this vaccine, please call the office and we will send a prescription to your pharmacy for them to administer.  You are up to date on all your preventative treatments and labs.  Please feel free to call with any questions or concerns.

## 2014-01-12 NOTE — Progress Notes (Signed)
Subjective:    Tina Bond is a 80 y.o. female who presents for Medicare Annual/Subsequent preventive examination.  Preventive Screening-Counseling & Management  Tobacco History  Smoking status  . Never Smoker   Smokeless tobacco  . Never Used     Problems Prior to Visit 1.   Current Problems (verified) Patient Active Problem List   Diagnosis Date Noted  . Cerumen impaction 12/23/2013  . Elevated alkaline phosphatase level 12/21/2013  . Environmental allergies 12/16/2012  . Arthritis 12/16/2012  . Polycythemia vera(238.4) 06/12/2012  . Depression 06/12/2012  . Diverticulosis 06/12/2012  . Hypercholesterolemia 06/10/2012  . Essential hypertension, benign 06/10/2012    Medications Prior to Visit Current Outpatient Prescriptions on File Prior to Visit  Medication Sig Dispense Refill  . acetaminophen (TYLENOL) 500 MG tablet Take 500 mg by mouth as needed for pain.      Marland Kitchen amLODipine (NORVASC) 10 MG tablet TAKE 1 TABLET DAILY  90 tablet  0  . aspirin 81 MG tablet Take 81 mg by mouth daily.      Marland Kitchen atorvastatin (LIPITOR) 40 MG tablet TAKE 1 TABLET DAILY  90 tablet  1  . azelastine (ASTELIN) 137 MCG/SPRAY nasal spray Place 1 spray into the nose 2 (two) times daily. Use in each nostril as directed      . fexofenadine (ALLEGRA) 180 MG tablet Take 180 mg by mouth daily.      . fluticasone (FLONASE) 50 MCG/ACT nasal spray Place 2 sprays into the nose daily.      . hydrochlorothiazide (HYDRODIURIL) 25 MG tablet TAKE 1 TABLET DAILY  90 tablet  0  . hydroxyurea (HYDREA) 500 MG capsule 500 mg. Dosed by Dr Cynda Acres      . lisinopril (PRINIVIL,ZESTRIL) 40 MG tablet TAKE 1 TABLET DAILY  90 tablet  0  . Multiple Vitamin (MULTIVITAMIN) tablet Take 1 tablet by mouth daily.       No current facility-administered medications on file prior to visit.    Current Medications (verified) Current Outpatient Prescriptions  Medication Sig Dispense Refill  . acetaminophen (TYLENOL) 500 MG  tablet Take 500 mg by mouth as needed for pain.      Marland Kitchen amLODipine (NORVASC) 10 MG tablet TAKE 1 TABLET DAILY  90 tablet  0  . aspirin 81 MG tablet Take 81 mg by mouth daily.      Marland Kitchen atorvastatin (LIPITOR) 40 MG tablet TAKE 1 TABLET DAILY  90 tablet  1  . azelastine (ASTELIN) 137 MCG/SPRAY nasal spray Place 1 spray into the nose 2 (two) times daily. Use in each nostril as directed      . fexofenadine (ALLEGRA) 180 MG tablet Take 180 mg by mouth daily.      . fluticasone (FLONASE) 50 MCG/ACT nasal spray Place 2 sprays into the nose daily.      . hydrochlorothiazide (HYDRODIURIL) 25 MG tablet TAKE 1 TABLET DAILY  90 tablet  0  . hydroxyurea (HYDREA) 500 MG capsule 500 mg. Dosed by Dr Cynda Acres      . lisinopril (PRINIVIL,ZESTRIL) 40 MG tablet TAKE 1 TABLET DAILY  90 tablet  0  . Multiple Vitamin (MULTIVITAMIN) tablet Take 1 tablet by mouth daily.       No current facility-administered medications for this visit.     Allergies (verified) Penicillins and Sulfa antibiotics   PAST HISTORY  Family History Family History  Problem Relation Age of Onset  . Hypertension Mother   . Cancer Neg Hx     No colon or  breast cancer    Social History History  Substance Use Topics  . Smoking status: Never Smoker   . Smokeless tobacco: Never Used  . Alcohol Use: No     Are there smokers in your home (other than you)? No  Risk Factors Current exercise habits: The patient does not participate in regular exercise at present.  Dietary issues discussed: Describes diet as good. Eats lean protein, fruits and vegetables   Cardiac risk factors: advanced age (older than 96 for men, 19 for women), dyslipidemia, hypertension and sedentary lifestyle.  Depression Screen (Note: if answer to either of the following is "Yes", a more complete depression screening is indicated)   Over the past two weeks, have you felt down, depressed or hopeless? No  Over the past two weeks, have you felt little interest or  pleasure in doing things? No  Have you lost interest or pleasure in daily life? No  Do you often feel hopeless? No  Do you cry easily over simple problems? No  Activities of Daily Living In your present state of health, do you have any difficulty performing the following activities?:  Driving? Yes Managing money?  No Feeding yourself? No Getting from bed to chair? NoNo exam performed today, Medicare Wellness. Climbing a flight of stairs? Yes Preparing food and eating?: No Bathing or showering? No Getting dressed: No Getting to the toilet? No Using the toilet:No Moving around from place to place: No In the past year have you fallen or had a near fall?:No   Are you sexually active?  No  Do you have more than one partner?  No  Hearing Difficulties: No Do you often ask people to speak up or repeat themselves? No Do you experience ringing or noises in your ears? No Do you have difficulty understanding soft or whispered voices? No   Do you feel that you have a problem with memory? No  Do you often misplace items? No  Do you feel safe at home?  Yes  Cognitive Testing  Alert? Yes  Normal Appearance?Yes  Oriented to person? Yes  Place? Yes   Time? Yes  Recall of three objects?  Yes  Can perform simple calculations? Yes  Displays appropriate judgment?Yes  Can read the correct time from a watch face?Yes   Advanced Directives have been discussed with the patient? Yes  List the Names of Other Physician/Practitioners you currently use: 1.  Dr. Inez Pilgrim - Oncology   Indicate any recent Medical Services you may have received from other than [facility] providers in the past year (date may be approximate).  Immunization History  Administered Date(s) Administered  . DTaP 04/30/2010  . Influenza-Unspecified 01/08/2013  . Pneumococcal Conjugate-13 06/18/2013  . Pneumococcal Polysaccharide-23 03/19/2000    Screening Tests Health Maintenance  Topic Date Due  . Tetanus/tdap  12/13/1941   . Zostavax  12/14/1982  . Colonoscopy  06/28/2012  . Influenza Vaccine  11/28/2013  . Pneumococcal Polysaccharide Vaccine Age 42 And Over  Completed    All answers were reviewed with the patient and necessary referrals were made:  Rey,Raquel, NP   01/12/2014   History reviewed: allergies, current medications, past family history, past medical history, past social history, past surgical history and problem list  Review of Systems No ROS. Medicare Wellness    Objective:     Vision by Snellen chart: right eye:20/25, left eye:20/25  There is no weight on file to calculate BMI. LMP 06/10/1958  No exam performed today, Medicare Wellness.  Assessment:      This is a routine wellness  examination for this patient . I reviewed all health maintenance protocols including mammography, colonoscopy, bone density Needed referrals were placed. Age and diagnosis  appropriate screening labs were ordered. Her immunization history was reviewed and appropriate vaccinations were ordered. Her current medications and allergies were reviewed and needed refills of her chronic medications were ordered. The plan for yearly health maintenance was discussed all orders and referrals were made as appropriate.       Plan:     During the course of the visit the patient was educated and counseled about appropriate screening and preventive services including:    Influenza vaccine  Shingles Vaccine  Diet review for nutrition referral? Yes ____  Not Indicated _x___   Patient Instructions (the written plan) was given to the patient.  Medicare Attestation I have personally reviewed: The patient's medical and social history Their use of alcohol, tobacco or illicit drugs Their current medications and supplements The patient's functional ability including ADLs,fall risks, home safety risks, cognitive, and hearing and visual impairment Diet and physical activities Evidence for depression or mood  disorders  The patient's weight, height, BMI, and visual acuity have been recorded in the chart.  I have made referrals, counseling, and provided education to the patient based on review of the above and I have provided the patient with a written personalized care plan for preventive services.     Rey,Raquel, NP   01/12/2014

## 2014-01-13 ENCOUNTER — Encounter: Payer: Self-pay | Admitting: *Deleted

## 2014-01-20 ENCOUNTER — Other Ambulatory Visit: Payer: Self-pay | Admitting: Internal Medicine

## 2014-01-20 LAB — CBC CANCER CENTER
Basophil #: 0.1 x10 3/mm (ref 0.0–0.1)
Basophil %: 1.5 %
Eosinophil #: 0.2 x10 3/mm (ref 0.0–0.7)
Eosinophil %: 3.2 %
HCT: 41.5 % (ref 35.0–47.0)
HGB: 13.4 g/dL (ref 12.0–16.0)
Lymphocyte #: 1.8 x10 3/mm (ref 1.0–3.6)
Lymphocyte %: 24.5 %
MCH: 36 pg — ABNORMAL HIGH (ref 26.0–34.0)
MCHC: 32.3 g/dL (ref 32.0–36.0)
MCV: 112 fL — ABNORMAL HIGH (ref 80–100)
MONOS PCT: 9.9 %
Monocyte #: 0.7 x10 3/mm (ref 0.2–0.9)
NEUTROS PCT: 60.9 %
Neutrophil #: 4.4 x10 3/mm (ref 1.4–6.5)
PLATELETS: 307 x10 3/mm (ref 150–440)
RBC: 3.72 10*6/uL — AB (ref 3.80–5.20)
RDW: 14.8 % — AB (ref 11.5–14.5)
WBC: 7.3 x10 3/mm (ref 3.6–11.0)

## 2014-01-22 ENCOUNTER — Other Ambulatory Visit: Payer: Self-pay | Admitting: Internal Medicine

## 2014-01-28 ENCOUNTER — Ambulatory Visit: Payer: Self-pay | Admitting: Internal Medicine

## 2014-02-11 ENCOUNTER — Other Ambulatory Visit: Payer: Self-pay | Admitting: Internal Medicine

## 2014-02-22 LAB — CBC CANCER CENTER
Basophil #: 0.1 x10 3/mm (ref 0.0–0.1)
Basophil %: 2.3 %
EOS ABS: 0.2 x10 3/mm (ref 0.0–0.7)
Eosinophil %: 2.5 %
HCT: 43.8 % (ref 35.0–47.0)
HGB: 14.2 g/dL (ref 12.0–16.0)
Lymphocyte #: 1.6 x10 3/mm (ref 1.0–3.6)
Lymphocyte %: 26.6 %
MCH: 36.3 pg — AB (ref 26.0–34.0)
MCHC: 32.3 g/dL (ref 32.0–36.0)
MCV: 112 fL — AB (ref 80–100)
MONO ABS: 0.8 x10 3/mm (ref 0.2–0.9)
MONOS PCT: 12.4 %
NEUTROS PCT: 56.2 %
Neutrophil #: 3.5 x10 3/mm (ref 1.4–6.5)
Platelet: 277 x10 3/mm (ref 150–440)
RBC: 3.9 10*6/uL (ref 3.80–5.20)
RDW: 15.3 % — AB (ref 11.5–14.5)
WBC: 6.2 x10 3/mm (ref 3.6–11.0)

## 2014-02-28 ENCOUNTER — Ambulatory Visit: Payer: Self-pay | Admitting: Internal Medicine

## 2014-03-22 LAB — CBC CANCER CENTER
Basophil #: 0.1 x10 3/mm (ref 0.0–0.1)
Basophil %: 1.3 %
Eosinophil #: 0.1 x10 3/mm (ref 0.0–0.7)
Eosinophil %: 2.4 %
HCT: 40.8 % (ref 35.0–47.0)
HGB: 13.1 g/dL (ref 12.0–16.0)
Lymphocyte #: 1.6 x10 3/mm (ref 1.0–3.6)
Lymphocyte %: 25.2 %
MCH: 36.2 pg — ABNORMAL HIGH (ref 26.0–34.0)
MCHC: 32 g/dL (ref 32.0–36.0)
MCV: 113 fL — ABNORMAL HIGH (ref 80–100)
MONO ABS: 0.6 x10 3/mm (ref 0.2–0.9)
MONOS PCT: 8.9 %
Neutrophil #: 3.9 x10 3/mm (ref 1.4–6.5)
Neutrophil %: 62.2 %
Platelet: 274 x10 3/mm (ref 150–440)
RBC: 3.61 10*6/uL — ABNORMAL LOW (ref 3.80–5.20)
RDW: 14.7 % — AB (ref 11.5–14.5)
WBC: 6.2 x10 3/mm (ref 3.6–11.0)

## 2014-03-30 ENCOUNTER — Ambulatory Visit: Payer: Self-pay | Admitting: Internal Medicine

## 2014-04-07 ENCOUNTER — Other Ambulatory Visit: Payer: Self-pay | Admitting: Internal Medicine

## 2014-05-05 ENCOUNTER — Ambulatory Visit: Payer: Self-pay | Admitting: Internal Medicine

## 2014-05-05 LAB — CBC CANCER CENTER
BASOS PCT: 1.5 %
Basophil #: 0.1 x10 3/mm (ref 0.0–0.1)
EOS ABS: 0.2 x10 3/mm (ref 0.0–0.7)
Eosinophil %: 1.9 %
HCT: 39 % (ref 35.0–47.0)
HGB: 12.8 g/dL (ref 12.0–16.0)
LYMPHS ABS: 1.8 x10 3/mm (ref 1.0–3.6)
Lymphocyte %: 23.8 %
MCH: 36.6 pg — AB (ref 26.0–34.0)
MCHC: 32.8 g/dL (ref 32.0–36.0)
MCV: 112 fL — ABNORMAL HIGH (ref 80–100)
MONO ABS: 0.8 x10 3/mm (ref 0.2–0.9)
Monocyte %: 10.3 %
NEUTROS ABS: 4.9 x10 3/mm (ref 1.4–6.5)
NEUTROS PCT: 62.5 %
PLATELETS: 321 x10 3/mm (ref 150–440)
RBC: 3.49 10*6/uL — AB (ref 3.80–5.20)
RDW: 14.3 % (ref 11.5–14.5)
WBC: 7.8 x10 3/mm (ref 3.6–11.0)

## 2014-05-12 ENCOUNTER — Other Ambulatory Visit: Payer: Self-pay | Admitting: *Deleted

## 2014-05-12 MED ORDER — ATORVASTATIN CALCIUM 40 MG PO TABS: 40.0000 mg | ORAL_TABLET | Freq: Every day | ORAL | Status: DC

## 2014-05-31 ENCOUNTER — Ambulatory Visit: Payer: Self-pay | Admitting: Internal Medicine

## 2014-06-24 ENCOUNTER — Ambulatory Visit: Payer: 59 | Admitting: Internal Medicine

## 2014-06-29 ENCOUNTER — Ambulatory Visit
Admit: 2014-06-29 | Disposition: A | Discharge status: Home / Self Care | Payer: Self-pay | Attending: Internal Medicine | Admitting: Internal Medicine

## 2014-07-28 LAB — CBC CANCER CENTER
BASOS ABS: 0.2 x10 3/mm — AB (ref 0.0–0.1)
Basophil %: 2.3 %
EOS PCT: 2.5 %
Eosinophil #: 0.2 x10 3/mm (ref 0.0–0.7)
HCT: 39.1 % (ref 35.0–47.0)
HGB: 12.6 g/dL (ref 12.0–16.0)
Lymphocyte #: 2.1 x10 3/mm (ref 1.0–3.6)
Lymphocyte %: 27.1 %
MCH: 33.5 pg (ref 26.0–34.0)
MCHC: 32.2 g/dL (ref 32.0–36.0)
MCV: 104 fL — ABNORMAL HIGH (ref 80–100)
MONOS PCT: 7 %
Monocyte #: 0.5 x10 3/mm (ref 0.2–0.9)
NEUTROS PCT: 61.1 %
Neutrophil #: 4.7 x10 3/mm (ref 1.4–6.5)
PLATELETS: 319 x10 3/mm (ref 150–440)
RBC: 3.76 10*6/uL — ABNORMAL LOW (ref 3.80–5.20)
RDW: 13.5 % (ref 11.5–14.5)
WBC: 7.7 x10 3/mm (ref 3.6–11.0)

## 2014-07-30 ENCOUNTER — Ambulatory Visit
Admit: 2014-07-30 | Disposition: A | Discharge status: Home / Self Care | Payer: Self-pay | Attending: Internal Medicine | Admitting: Internal Medicine

## 2014-08-19 ENCOUNTER — Ambulatory Visit (INDEPENDENT_AMBULATORY_CARE_PROVIDER_SITE_OTHER): Payer: Medicare Other | Admitting: Internal Medicine

## 2014-08-19 ENCOUNTER — Other Ambulatory Visit: Payer: Self-pay | Admitting: Internal Medicine

## 2014-08-19 ENCOUNTER — Encounter: Payer: Self-pay | Admitting: Internal Medicine

## 2014-08-19 VITALS — BP 122/66 | HR 86 | Temp 97.8°F | Ht 64.0 in | Wt 162.1 lb

## 2014-08-19 DIAGNOSIS — D45 Polycythemia vera: Secondary | ICD-10-CM

## 2014-08-19 DIAGNOSIS — R748 Abnormal levels of other serum enzymes: Secondary | ICD-10-CM | POA: Diagnosis not present

## 2014-08-19 DIAGNOSIS — I10 Essential (primary) hypertension: Secondary | ICD-10-CM

## 2014-08-19 DIAGNOSIS — M1990 Unspecified osteoarthritis, unspecified site: Secondary | ICD-10-CM

## 2014-08-19 DIAGNOSIS — E7800 Pure hypercholesterolemia, unspecified: Secondary | ICD-10-CM

## 2014-08-19 DIAGNOSIS — F329 Major depressive disorder, single episode, unspecified: Secondary | ICD-10-CM | POA: Diagnosis not present

## 2014-08-19 DIAGNOSIS — F32A Depression, unspecified: Secondary | ICD-10-CM

## 2014-08-19 LAB — BASIC METABOLIC PANEL
BUN: 26 mg/dL — ABNORMAL HIGH (ref 6–23)
CO2: 26 meq/L (ref 19–32)
Calcium: 10 mg/dL (ref 8.4–10.5)
Chloride: 101 mEq/L (ref 96–112)
Creatinine, Ser: 1.06 mg/dL (ref 0.40–1.20)
GFR: 51.57 mL/min — AB (ref >60.00)
Glucose, Bld: 88 mg/dL (ref 70–99)
POTASSIUM: 3.8 meq/L (ref 3.5–5.1)
SODIUM: 137 meq/L (ref 135–145)

## 2014-08-19 LAB — HEPATIC FUNCTION PANEL
ALT: 23 U/L (ref 0–35)
AST: 31 U/L (ref 0–37)
Albumin: 4.4 g/dL (ref 3.5–5.2)
Alkaline Phosphatase: 163 U/L — ABNORMAL HIGH (ref 39–117)
BILIRUBIN DIRECT: 0 mg/dL (ref 0.0–0.3)
Total Bilirubin: 0.6 mg/dL (ref 0.2–1.2)
Total Protein: 8.9 g/dL — ABNORMAL HIGH (ref 6.0–8.3)

## 2014-08-19 LAB — TSH: TSH: 2.58 u[IU]/mL (ref 0.35–4.50)

## 2014-08-19 NOTE — Progress Notes (Signed)
Patient ID: Tina Bond, female   DOB: 13-Mar-1921, 81 y.o.   MRN: [id number]   Subjective:    Patient ID: Tina Bond, female    DOB: 1921/04/03, 81 y.o.   MRN: [id number]  HPI  Patient here for a scheduled follow up.  She reports that she fell at the end of 05/2014.  States turned and went down slowly.  Hit the side of her leg.  Bruise.  Went to urgent care.  No xray.  Pain is better.  States no pain now with weight bearing.  Still using her walker.  Feels she can get around easier with the walker.  Describes pain - all over.  Joints bother her.  Takes tylenol and advil.  Sees Dr Cynda Acres.  Counts stable.     Past Medical History  Diagnosis Date  . Hypertension   . Pure hypercholesterolemia   . Osteoarthritis   . History of degenerative disc disease   . History of Clostridium difficile infection     After antibiotics of a dental procedure  . Coronary artery disease     Status post heart cath with two insignificant vessel blockages, patient chose medical management  . Polycythemia vera(238.4)      [place] ([place])    Review of Systems  Constitutional: Negative for appetite change and unexpected weight change.  HENT: Negative for congestion and sinus pressure.   Respiratory: Negative for cough, chest tightness and shortness of breath.   Cardiovascular: Negative for chest pain, palpitations and leg swelling.  Gastrointestinal: Negative for nausea, vomiting, abdominal pain and diarrhea.  Genitourinary: Negative for dysuria and difficulty urinating.  Musculoskeletal:       Joint pain - pain all over taking advil and tylenol.    Skin: Negative for color change and rash.  Neurological: Negative for dizziness, light-headedness and headaches.  Psychiatric/Behavioral: Negative for dysphoric mood and agitation.       Objective:    Physical Exam  HENT:  Nose: Nose normal.  Mouth/Throat: Oropharynx is clear and moist.  Neck: Neck supple. No thyromegaly present.  Cardiovascular:  Normal rate and regular rhythm.   Pulmonary/Chest: Breath sounds normal. No respiratory distress. She has no wheezes.  Abdominal: Soft. Bowel sounds are normal. There is no tenderness.  Musculoskeletal: She exhibits no edema or tenderness.  Ambulates with walker.   Lymphadenopathy:    She has no cervical adenopathy.  Skin: No rash noted. No erythema.    BP 122/66 mmHg  Pulse 86  Temp(Src) 97.8 F (36.6 C) (Oral)  Ht 5\' 4"  (1.626 m)  Wt 162 lb 2 oz (73.539 kg)  BMI 27.81 kg/m2  SpO2 95%  LMP 06/10/1958 Wt Readings from Last 3 Encounters:  07/28/14 162 lb 11.2 oz (73.8 kg)  08/19/14 162 lb 2 oz (73.539 kg)  12/21/13 161 lb (73.029 kg)     Lab Results  Component Value Date   WBC 7.7 07/28/2014   HGB 12.6 07/28/2014   HCT 39.1 07/28/2014   PLT 319 07/28/2014   GLUCOSE 88 08/19/2014   CHOL 117 12/15/2013   TRIG 113.0 12/15/2013   HDL 37.90* 12/15/2013   LDLCALC 57 12/15/2013   ALT 23 08/19/2014   AST 31 08/19/2014   NA 137 08/19/2014   K 3.8 08/19/2014   CL 101 08/19/2014   CREATININE 1.06 08/19/2014   BUN 26* 08/19/2014   CO2 26 08/19/2014   TSH 2.58 08/19/2014       Assessment & Plan:   Problem List Items Addressed  This Visit    Arthritis    Joint stiffness as outlined.  Increased pain.  Tylenol as directed.  Follow.  Continue with walker.  Helps.        Depression    Doing better.  Follow.        Elevated alkaline phosphatase level    Alkaline phos level wnl - 01/12/14.        Relevant Orders   TSH (Completed)   Hepatic function panel (Completed)   Essential hypertension, benign - Primary    Blood pressure doing well.  Same medication regimen.  Follow pressures.  Follow metabolic panel.       Relevant Orders   Basic metabolic panel (Completed)   Hypercholesterolemia    Low cholesterol diet and exercise.  On atorvastatin.  Follow lipid panel and liver function tests.        Relevant Orders   Lipid panel   Hepatic function panel (Completed)    Polycythemia vera    Sees Dr Cynda Acres.  Stable.          I spent 25 minutes with the patient and more than 50% of the time was spent in consultation regarding the above.     Einar Pheasant, MD

## 2014-08-19 NOTE — Progress Notes (Signed)
Pre visit review using our clinic review tool, if applicable. No additional management support is needed unless otherwise documented below in the visit note. 

## 2014-08-20 ENCOUNTER — Other Ambulatory Visit: Payer: Self-pay | Admitting: Internal Medicine

## 2014-08-20 DIAGNOSIS — E8809 Other disorders of plasma-protein metabolism, not elsewhere classified: Secondary | ICD-10-CM

## 2014-08-20 DIAGNOSIS — R748 Abnormal levels of other serum enzymes: Secondary | ICD-10-CM

## 2014-08-20 DIAGNOSIS — R779 Abnormality of plasma protein, unspecified: Secondary | ICD-10-CM

## 2014-08-20 NOTE — Progress Notes (Signed)
Order placed for f/u labs.  

## 2014-08-29 ENCOUNTER — Encounter: Payer: Self-pay | Admitting: Internal Medicine

## 2014-08-29 NOTE — Assessment & Plan Note (Signed)
Doing better.  Follow.   

## 2014-08-29 NOTE — Assessment & Plan Note (Signed)
Blood pressure doing well.  Same medication regimen.  Follow pressures.  Follow metabolic panel.   

## 2014-08-29 NOTE — Assessment & Plan Note (Signed)
Joint stiffness as outlined.  Increased pain.  Tylenol as directed.  Follow.  Continue with walker.  Helps.

## 2014-08-29 NOTE — Assessment & Plan Note (Signed)
Alkaline phos level wnl - 01/12/14.

## 2014-08-29 NOTE — Assessment & Plan Note (Signed)
Sees Dr Cynda Acres.  Stable.

## 2014-08-29 NOTE — Assessment & Plan Note (Signed)
Low cholesterol diet and exercise.  On atorvastatin.  Follow lipid panel and liver function tests.   

## 2014-09-08 ENCOUNTER — Inpatient Hospital Stay: Payer: Medicare Other | Attending: Internal Medicine

## 2014-09-08 ENCOUNTER — Inpatient Hospital Stay: Payer: Medicare Other

## 2014-09-08 DIAGNOSIS — D45 Polycythemia vera: Secondary | ICD-10-CM | POA: Diagnosis present

## 2014-09-08 LAB — CBC WITH DIFFERENTIAL/PLATELET
Basophils Absolute: 0.1 10*3/uL (ref 0–0.1)
Basophils Relative: 1 %
EOS PCT: 2 %
Eosinophils Absolute: 0.2 10*3/uL (ref 0–0.7)
HCT: 41.3 % (ref 35.0–47.0)
Hemoglobin: 13.3 g/dL (ref 12.0–16.0)
Lymphocytes Relative: 24 %
Lymphs Abs: 2 10*3/uL (ref 1.0–3.6)
MCH: 32.8 pg (ref 26.0–34.0)
MCHC: 32.2 g/dL (ref 32.0–36.0)
MCV: 102 fL — ABNORMAL HIGH (ref 80.0–100.0)
Monocytes Absolute: 0.7 10*3/uL (ref 0.2–0.9)
Monocytes Relative: 9 %
NEUTROS PCT: 64 %
Neutro Abs: 5.3 10*3/uL (ref 1.4–6.5)
Platelets: 321 10*3/uL (ref 150–440)
RBC: 4.05 MIL/uL (ref 3.80–5.20)
RDW: 15.6 % — ABNORMAL HIGH (ref 11.5–14.5)
WBC: 8.3 10*3/uL (ref 3.6–11.0)

## 2014-09-13 ENCOUNTER — Other Ambulatory Visit: Payer: Self-pay | Admitting: Internal Medicine

## 2014-10-16 ENCOUNTER — Other Ambulatory Visit: Payer: Self-pay | Admitting: Internal Medicine

## 2014-10-17 ENCOUNTER — Other Ambulatory Visit: Payer: Self-pay | Admitting: Internal Medicine

## 2014-10-20 ENCOUNTER — Inpatient Hospital Stay: Payer: Medicare Other

## 2014-10-20 ENCOUNTER — Inpatient Hospital Stay: Payer: Medicare Other | Attending: Family Medicine | Admitting: Family Medicine

## 2014-10-20 DIAGNOSIS — D45 Polycythemia vera: Secondary | ICD-10-CM | POA: Diagnosis present

## 2014-10-20 LAB — CBC WITH DIFFERENTIAL/PLATELET
BASOS ABS: 0.2 10*3/uL — AB (ref 0–0.1)
BASOS PCT: 3 %
Eosinophils Absolute: 0.2 10*3/uL (ref 0–0.7)
Eosinophils Relative: 3 %
HCT: 42.7 % (ref 35.0–47.0)
Hemoglobin: 13.6 g/dL (ref 12.0–16.0)
LYMPHS PCT: 24 %
Lymphs Abs: 1.8 10*3/uL (ref 1.0–3.6)
MCH: 33.5 pg (ref 26.0–34.0)
MCHC: 31.9 g/dL — AB (ref 32.0–36.0)
MCV: 104.8 fL — ABNORMAL HIGH (ref 80.0–100.0)
MONO ABS: 0.7 10*3/uL (ref 0.2–0.9)
Monocytes Relative: 9 %
Neutro Abs: 4.4 10*3/uL (ref 1.4–6.5)
Neutrophils Relative %: 61 %
Platelets: 312 10*3/uL (ref 150–440)
RBC: 4.07 MIL/uL (ref 3.80–5.20)
RDW: 17.5 % — AB (ref 11.5–14.5)
WBC: 7.2 10*3/uL (ref 3.6–11.0)

## 2014-11-05 NOTE — Progress Notes (Signed)
This encounter was created in error - please disregard.

## 2014-12-01 ENCOUNTER — Other Ambulatory Visit: Payer: Self-pay | Admitting: Family Medicine

## 2014-12-01 ENCOUNTER — Inpatient Hospital Stay: Payer: Medicare Other | Attending: Family Medicine

## 2014-12-01 DIAGNOSIS — D45 Polycythemia vera: Secondary | ICD-10-CM | POA: Diagnosis present

## 2014-12-01 LAB — HEMATOCRIT: HCT: 42.9 % (ref 35.0–47.0)

## 2014-12-21 ENCOUNTER — Other Ambulatory Visit: Payer: Self-pay | Admitting: Internal Medicine

## 2015-01-06 ENCOUNTER — Encounter: Payer: Self-pay | Admitting: Internal Medicine

## 2015-01-14 ENCOUNTER — Other Ambulatory Visit: Payer: Self-pay | Admitting: Internal Medicine

## 2015-01-24 ENCOUNTER — Encounter: Payer: Self-pay | Admitting: *Deleted

## 2015-01-25 ENCOUNTER — Other Ambulatory Visit: Payer: Self-pay | Admitting: *Deleted

## 2015-01-25 DIAGNOSIS — D45 Polycythemia vera: Secondary | ICD-10-CM

## 2015-01-26 ENCOUNTER — Inpatient Hospital Stay: Payer: Medicare Other | Attending: Internal Medicine

## 2015-01-26 ENCOUNTER — Ambulatory Visit: Payer: Medicare Other

## 2015-01-26 ENCOUNTER — Inpatient Hospital Stay: Payer: Medicare Other

## 2015-01-26 ENCOUNTER — Inpatient Hospital Stay (HOSPITAL_BASED_OUTPATIENT_CLINIC_OR_DEPARTMENT_OTHER): Payer: Medicare Other | Admitting: Internal Medicine

## 2015-01-26 VITALS — BP 116/69 | HR 74 | Temp 97.5°F

## 2015-01-26 DIAGNOSIS — D45 Polycythemia vera: Secondary | ICD-10-CM | POA: Diagnosis not present

## 2015-01-26 DIAGNOSIS — Z7982 Long term (current) use of aspirin: Secondary | ICD-10-CM | POA: Diagnosis not present

## 2015-01-26 DIAGNOSIS — Z79899 Other long term (current) drug therapy: Secondary | ICD-10-CM | POA: Diagnosis not present

## 2015-01-26 DIAGNOSIS — R5383 Other fatigue: Secondary | ICD-10-CM | POA: Insufficient documentation

## 2015-01-26 DIAGNOSIS — Z23 Encounter for immunization: Secondary | ICD-10-CM

## 2015-01-26 LAB — CBC WITH DIFFERENTIAL/PLATELET
BASOS ABS: 0.1 10*3/uL (ref 0–0.1)
BASOS ABS: 0.1 10*3/uL (ref 0–0.1)
BASOS PCT: 2 %
Basophils Relative: 2 %
EOS ABS: 0.2 10*3/uL (ref 0–0.7)
Eosinophils Absolute: 0.1 10*3/uL (ref 0–0.7)
Eosinophils Relative: 1 %
Eosinophils Relative: 2 %
HCT: 43.1 % (ref 35.0–47.0)
HEMATOCRIT: 44.9 % (ref 35.0–47.0)
Hemoglobin: 14.3 g/dL (ref 12.0–16.0)
Hemoglobin: 15 g/dL (ref 12.0–16.0)
Lymphocytes Relative: 22 %
Lymphocytes Relative: 21 %
Lymphs Abs: 1.8 10*3/uL (ref 1.0–3.6)
Lymphs Abs: 1.7 10*3/uL (ref 1.0–3.6)
MCH: 36.2 pg — ABNORMAL HIGH (ref 26.0–34.0)
MCH: 36.6 pg — ABNORMAL HIGH (ref 26.0–34.0)
MCHC: 33.3 g/dL (ref 32.0–36.0)
MCHC: 33.5 g/dL (ref 32.0–36.0)
MCV: 108.8 fL — AB (ref 80.0–100.0)
MCV: 109.5 fL — ABNORMAL HIGH (ref 80.0–100.0)
MONOS PCT: 11 %
Monocytes Absolute: 0.5 10*3/uL (ref 0.2–0.9)
Monocytes Absolute: 0.9 10*3/uL (ref 0.2–0.9)
Monocytes Relative: 7 %
NEUTROS ABS: 5.5 10*3/uL (ref 1.4–6.5)
NEUTROS PCT: 64 %
Neutro Abs: 5.2 10*3/uL (ref 1.4–6.5)
Neutrophils Relative %: 68 %
Platelets: 330 10*3/uL (ref 150–440)
Platelets: 332 10*3/uL (ref 150–440)
RBC: 3.96 MIL/uL (ref 3.80–5.20)
RBC: 4.1 MIL/uL (ref 3.80–5.20)
RDW: 15 % — ABNORMAL HIGH (ref 11.5–14.5)
RDW: 14.9 % — ABNORMAL HIGH (ref 11.5–14.5)
WBC: 8 10*3/uL (ref 3.6–11.0)
WBC: 8.1 10*3/uL (ref 3.6–11.0)

## 2015-01-26 MED ORDER — INFLUENZA VAC SPLIT QUAD 0.5 ML IM SUSY
0.5000 mL | PREFILLED_SYRINGE | INTRAMUSCULAR | Status: AC
  Administered 2015-01-26: 0.5 mL via INTRAMUSCULAR

## 2015-01-26 NOTE — Progress Notes (Signed)
[place] OFFICE PROGRESS NOTE  Patient Care Team: Einar Pheasant, MD as PCP - General (Internal Medicine)  HPI  SUMMARY of HEMATOLOGIC HISTORY:  # POLYCYTHEMIA VERA s/p BMBx- MAY 2010- hypercellular; no blasts; LOW EPO; NEG JAK-2 [no records available] on Hydrea 7 pill/week; Asa/d   INTERVAL HISTORY:  This is my first interaction with the patient since I joined the practice September 2016. I reviewed the patient's prior chart/pertinent labs/imaging in detail; findings are summarized.  A very pleasant 81 year old female patient with above history of polycythemia vera on Hydrea/periodic phlebotomies is here for follow-up. Paient last phlebotomy was more than 6 months ago.  Patient complains of mild fatigue which is chronic. Denies any unusual headaches or vision changes or double vision. Denies any discoloration of the fingertips or toes.  REVIEW OF SYSTEMS: No weight loss night sweats or fevers. No skin changes or oral ulcers. No A complete 10 point review of system is done which is negative except mentioned above in history of present illness  I have reviewed the past medical history, past surgical history, social history and family history with the patient and they are unchanged from previous note unless stated above.  ALLERGIES:  is allergic to penicillins and sulfa antibiotics.  MEDICATIONS:  Current Outpatient Prescriptions  Medication Sig Dispense Refill  . acetaminophen (TYLENOL) 500 MG tablet Take 500 mg by mouth as needed for pain.    Marland Kitchen amLODipine (NORVASC) 10 MG tablet TAKE 1 TABLET DAILY 90 tablet 1  . aspirin 81 MG tablet Take 81 mg by mouth daily.    Marland Kitchen atorvastatin (LIPITOR) 40 MG tablet TAKE 1 TABLET DAILY 90 tablet 2  . azelastine (ASTELIN) 137 MCG/SPRAY nasal spray Place 1 spray into the nose 2 (two) times daily. Use in each nostril as directed    . fexofenadine (ALLEGRA) 180 MG tablet Take 180 mg by mouth daily.    . fluticasone (FLONASE) 50 MCG/ACT  nasal spray USE 2 SPRAYS EACH NOSTRIL ONE TIME DAILY 16 g 5  . hydrochlorothiazide (HYDRODIURIL) 25 MG tablet TAKE 1 TABLET DAILY 90 tablet 1  . hydroxyurea (HYDREA) 500 MG capsule 500 mg. Dosed by Dr Cynda Acres    . lisinopril (PRINIVIL,ZESTRIL) 40 MG tablet TAKE 1 TABLET DAILY 90 tablet 1  . Multiple Vitamin (MULTIVITAMIN) tablet Take 1 tablet by mouth daily.     No current facility-administered medications for this visit.    PHYSICAL EXAMINATION:   LMP 06/10/1958  There were no vitals filed for this visit.  GENERAL: Well-nourished well-developed; Alert, no distress and comfortable.   Accompanied by a friend. Patient is resting in a wheelchair. EYES: no pallor or icterus OROPHARYNX: no thrush or ulceration; good dentition  NECK: supple, no masses felt LYMPH:  no palpable lymphadenopathy in the cervical, axillary or inguinal regions LUNGS: clear to auscultation and  No wheeze or crackles HEART/CVS: regular rate & rhythm and no murmurs; No lower extremity edema ABDOMEN:abdomen soft, non-tender and normal bowel sounds Musculoskeletal:no cyanosis of digits and no clubbing  PSYCH: alert & oriented x 3 with fluent speech NEURO: no focal motor/sensory deficits SKIN:  no rashes or significant lesions   LABORATORY DATA:  I have reviewed the data as listed    Component Value Date/Time   NA 137 08/19/2014 1137   K 3.8 08/19/2014 1137   CL 101 08/19/2014 1137   CO2 26 08/19/2014 1137   GLUCOSE 88 08/19/2014 1137   BUN 26* 08/19/2014 1137   CREATININE 1.06 08/19/2014 1137  CALCIUM 10.0 08/19/2014 1137   PROT 8.9* 08/19/2014 1137   ALBUMIN 4.4 08/19/2014 1137   AST 31 08/19/2014 1137   ALT 23 08/19/2014 1137   ALKPHOS 163* 08/19/2014 1137   BILITOT 0.6 08/19/2014 1137    No results found for: SPEP, UPEP  Lab Results  Component Value Date   WBC 8.0 01/26/2015   NEUTROABS 5.5 01/26/2015   HGB 14.3 01/26/2015   HCT 43.1 01/26/2015   MCV 108.8* 01/26/2015   PLT 330 01/26/2015       Chemistry      Component Value Date/Time   NA 137 08/19/2014 1137   K 3.8 08/19/2014 1137   CL 101 08/19/2014 1137   CO2 26 08/19/2014 1137   BUN 26* 08/19/2014 1137   CREATININE 1.06 08/19/2014 1137      Component Value Date/Time   CALCIUM 10.0 08/19/2014 1137   ALKPHOS 163* 08/19/2014 1137   AST 31 08/19/2014 1137   ALT 23 08/19/2014 1137   BILITOT 0.6 08/19/2014 1137       RADIOGRAPHIC STUDIES: I have personally reviewed the radiological images as listed and agreed with the findings in the report. No results found.   ASSESSMENT & PLAN:  Polycythemia vera- on Hydrea 500 mg once a day; except on Tuesdays when she takes 2 pills a day. Today the hematocrit is 43.5; the goal is 42 or less. I would recommend phlebotomy of 150 mL of blood today. Patient tolerating Hydrea well without any major side effects; continued aspirin 81 mg a day.  I think it's reasonable to have a three-month follow-up; labs; and phlebotomy if needed the same time.  I would recommend adding jack 2 mutation reflex to exon 12; and also an erythropoietin level to be added to today's visit.     No orders of the defined types were placed in this encounter.   All questions were answered. The patient knows to call the clinic with any problems, questions or concerns. No barriers to learning was detected. & I spent 15 minutes counseling the patient face to face. The total time spent in the appointment was 30 minutes and more than 50% was on counseling and review of test results     Cammie Sickle, MD 01/26/2015 2:32 PM

## 2015-02-11 LAB — JAK2  V617F QUAL. WITH REFLEX TO EXON 12

## 2015-02-11 LAB — ERYTHROPOIETIN: Erythropoietin: 7.2 m[IU]/mL (ref 2.6–18.5)

## 2015-03-10 ENCOUNTER — Other Ambulatory Visit: Payer: Self-pay | Admitting: Internal Medicine

## 2015-04-14 ENCOUNTER — Other Ambulatory Visit: Payer: Self-pay | Admitting: Internal Medicine

## 2015-04-27 ENCOUNTER — Ambulatory Visit: Payer: Medicare Other | Admitting: Family Medicine

## 2015-05-10 ENCOUNTER — Other Ambulatory Visit: Payer: Self-pay | Admitting: *Deleted

## 2015-05-10 DIAGNOSIS — D45 Polycythemia vera: Secondary | ICD-10-CM

## 2015-05-11 ENCOUNTER — Inpatient Hospital Stay: Payer: Medicare Other | Attending: Internal Medicine | Admitting: Internal Medicine

## 2015-05-11 ENCOUNTER — Inpatient Hospital Stay: Payer: Medicare Other

## 2015-05-11 VITALS — BP 128/75 | HR 88 | Temp 97.4°F | Resp 18 | Ht 64.0 in | Wt 154.0 lb

## 2015-05-11 DIAGNOSIS — Z79899 Other long term (current) drug therapy: Secondary | ICD-10-CM | POA: Diagnosis not present

## 2015-05-11 DIAGNOSIS — Z7982 Long term (current) use of aspirin: Secondary | ICD-10-CM | POA: Diagnosis not present

## 2015-05-11 DIAGNOSIS — D45 Polycythemia vera: Secondary | ICD-10-CM

## 2015-05-11 DIAGNOSIS — M129 Arthropathy, unspecified: Secondary | ICD-10-CM | POA: Diagnosis not present

## 2015-05-11 LAB — COMPREHENSIVE METABOLIC PANEL
ALBUMIN: 3.9 g/dL (ref 3.5–5.0)
ALT: 23 U/L (ref 14–54)
AST: 33 U/L (ref 15–41)
Alkaline Phosphatase: 123 U/L (ref 38–126)
Anion gap: 6 (ref 5–15)
BUN: 20 mg/dL (ref 6–20)
CO2: 27 mmol/L (ref 22–32)
Calcium: 8.7 mg/dL — ABNORMAL LOW (ref 8.9–10.3)
Chloride: 103 mmol/L (ref 101–111)
Creatinine, Ser: 1.04 mg/dL — ABNORMAL HIGH (ref 0.44–1.00)
GFR calc Af Amer: 52 mL/min — ABNORMAL LOW (ref >60)
GFR calc non Af Amer: 45 mL/min — ABNORMAL LOW (ref >60)
GLUCOSE: 205 mg/dL — AB (ref 65–99)
POTASSIUM: 3.6 mmol/L (ref 3.5–5.1)
SODIUM: 136 mmol/L (ref 135–145)
Total Bilirubin: 0.8 mg/dL (ref 0.3–1.2)
Total Protein: 7.8 g/dL (ref 6.5–8.1)

## 2015-05-11 LAB — CBC WITH DIFFERENTIAL/PLATELET
BASOS ABS: 0 10*3/uL (ref 0–0.1)
BASOS PCT: 1 %
EOS ABS: 0.1 10*3/uL (ref 0–0.7)
Eosinophils Relative: 2 %
HCT: 41.9 % (ref 35.0–47.0)
HEMOGLOBIN: 13.8 g/dL (ref 12.0–16.0)
Lymphocytes Relative: 19 %
Lymphs Abs: 1.3 10*3/uL (ref 1.0–3.6)
MCH: 35.2 pg — ABNORMAL HIGH (ref 26.0–34.0)
MCHC: 32.9 g/dL (ref 32.0–36.0)
MCV: 107.2 fL — ABNORMAL HIGH (ref 80.0–100.0)
MONOS PCT: 8 %
Monocytes Absolute: 0.6 10*3/uL (ref 0.2–0.9)
NEUTROS PCT: 70 %
Neutro Abs: 4.8 10*3/uL (ref 1.4–6.5)
Platelets: 375 10*3/uL (ref 150–440)
RBC: 3.9 MIL/uL (ref 3.80–5.20)
RDW: 13.3 % (ref 11.5–14.5)
WBC: 6.9 10*3/uL (ref 3.6–11.0)

## 2015-05-11 NOTE — Progress Notes (Signed)
Patient is here for follow-up of polycythemia vera and possible phlebotomy. Patient states that overall she has been doing well and had no complaints today.

## 2015-05-11 NOTE — Progress Notes (Signed)
[place] OFFICE PROGRESS NOTE  Patient Care Team: Einar Pheasant, MD as PCP - General (Internal Medicine)  HPI  SUMMARY of HEMATOLOGIC HISTORY:  # POLYCYTHEMIA VERA s/p BMBx- MAY 2010- hypercellular; no blasts; LOW EPO;JAK-2 POSITIVE on Hydrea 7 pill/week; Asa/d   INTERVAL HISTORY:  A very pleasant 82 year old female patient with above history of polycythemia vera on Hydrea/periodic phlebotomies is here for follow-up. Paient last phlebotomy was more than 3 months ago.  Patient complains of chronic arthritic pain in her joints. Denies any unusual headaches or vision changes or double vision. Denies any discoloration of the fingertips or toes.  REVIEW OF SYSTEMS: No weight loss night sweats or fevers. No skin changes or oral ulcers. No A complete 10 point review of system is done which is negative except mentioned above in history of present illness  I have reviewed the past medical history, past surgical history, social history and family history with the patient and they are unchanged from previous note unless stated above.  ALLERGIES:  is allergic to penicillins and sulfa antibiotics.  MEDICATIONS:  Current Outpatient Prescriptions  Medication Sig Dispense Refill  . acetaminophen (TYLENOL) 500 MG tablet Take 500 mg by mouth as needed for pain.    Marland Kitchen amLODipine (NORVASC) 10 MG tablet TAKE 1 TABLET DAILY 90 tablet 1  . aspirin 81 MG tablet Take 81 mg by mouth daily.    Marland Kitchen atorvastatin (LIPITOR) 40 MG tablet TAKE 1 TABLET DAILY 90 tablet 2  . azelastine (ASTELIN) 137 MCG/SPRAY nasal spray Place 1 spray into the nose 2 (two) times daily. Use in each nostril as directed    . fexofenadine (ALLEGRA) 180 MG tablet Take 180 mg by mouth daily.    . fluticasone (FLONASE) 50 MCG/ACT nasal spray USE 2 SPRAYS EACH NOSTRIL ONE TIME DAILY 16 g 5  . hydrochlorothiazide (HYDRODIURIL) 25 MG tablet TAKE 1 TABLET DAILY 90 tablet 1  . hydroxyurea (HYDREA) 500 MG capsule 500 mg. 1 capsule  daily except on Tuesdays when patient takes 2 capsules.    Marland Kitchen lisinopril (PRINIVIL,ZESTRIL) 40 MG tablet TAKE 1 TABLET DAILY 90 tablet 0  . Multiple Vitamin (MULTIVITAMIN) tablet Take 1 tablet by mouth daily.     No current facility-administered medications for this visit.    PHYSICAL EXAMINATION:   BP 128/75 mmHg  Pulse 88  Temp(Src) 97.4 F (36.3 C) (Tympanic)  Resp 18  Ht 5\' 4"  (1.626 m)  Wt 154 lb (69.854 kg)  BMI 26.42 kg/m2  LMP 06/10/1958  Filed Weights   05/11/15 1356  Weight: 154 lb (69.854 kg)    GENERAL: Well-nourished well-developed; Alert, no distress and comfortable.   Accompanied by a friend. Patient is resting in a wheelchair. EYES: no pallor or icterus OROPHARYNX: no thrush or ulceration; good dentition  NECK: supple, no masses felt LYMPH:  no palpable lymphadenopathy in the cervical, axillary or inguinal regions LUNGS: clear to auscultation and  No wheeze or crackles HEART/CVS: regular rate & rhythm and positive for murmurs; No lower extremity edema ABDOMEN:abdomen soft, non-tender and normal bowel sounds Musculoskeletal:no cyanosis of digits and no clubbing  PSYCH: alert & oriented x 3 with fluent speech NEURO: no focal motor/sensory deficits SKIN:  no rashes or significant lesions   LABORATORY DATA:  I have reviewed the data as listed    Component Value Date/Time   NA 136 05/11/2015 1346   K 3.6 05/11/2015 1346   CL 103 05/11/2015 1346   CO2 27 05/11/2015 1346  GLUCOSE 205* 05/11/2015 1346   BUN 20 05/11/2015 1346   CREATININE 1.04* 05/11/2015 1346   CALCIUM 8.7* 05/11/2015 1346   PROT 7.8 05/11/2015 1346   ALBUMIN 3.9 05/11/2015 1346   AST 33 05/11/2015 1346   ALT 23 05/11/2015 1346   ALKPHOS 123 05/11/2015 1346   BILITOT 0.8 05/11/2015 1346   GFRNONAA 45* 05/11/2015 1346   GFRAA 52* 05/11/2015 1346    No results found for: SPEP, UPEP  Lab Results  Component Value Date   WBC 6.9 05/11/2015   NEUTROABS 4.8 05/11/2015   HGB 13.8  05/11/2015   HCT 41.9 05/11/2015   MCV 107.2* 05/11/2015   PLT 375 05/11/2015      Chemistry      Component Value Date/Time   NA 136 05/11/2015 1346   K 3.6 05/11/2015 1346   CL 103 05/11/2015 1346   CO2 27 05/11/2015 1346   BUN 20 05/11/2015 1346   CREATININE 1.04* 05/11/2015 1346      Component Value Date/Time   CALCIUM 8.7* 05/11/2015 1346   ALKPHOS 123 05/11/2015 1346   AST 33 05/11/2015 1346   ALT 23 05/11/2015 1346   BILITOT 0.8 05/11/2015 1346       RADIOGRAPHIC STUDIES: I have personally reviewed the radiological images as listed and agreed with the findings in the report. No results found.   ASSESSMENT & PLAN:  Polycythemia vera- currently asymptomatic; on Hydrea 500 mg once a day; except on Tuesdays when she takes 2 pills a day. Today the hematocrit is 41.9 the goal is 42 or less. Hold phlebotomy today. In general patient gets a phlebotomy 1 50 mL.   Patient tolerating Hydrea well without any major side effects; continued aspirin 81 mg a day.  # H&H in 6 weeks/possible phlebotomy; CBC CMP in 3 months/possible phlebotomy/follow-up with me.  15 minutes counseling the patient face to face. The total time spent in the appointment was 30 minutes and more than 50% was on counseling and review of test results     Cammie Sickle, MD 05/11/2015 2:25 PM

## 2015-06-17 ENCOUNTER — Other Ambulatory Visit: Payer: Self-pay | Admitting: Internal Medicine

## 2015-06-17 NOTE — Telephone Encounter (Signed)
Patient wants to wait and decide if she can come in, she is concerned about a ride to the office, She will return a call to the office next week.  I have refilled for one month.

## 2015-06-17 NOTE — Telephone Encounter (Signed)
Last OV was in April of 2016. Please advise?

## 2015-06-17 NOTE — Telephone Encounter (Signed)
Needs cpe scheduled.  Ok to refill until appt.  Needs to keep appt.

## 2015-06-22 ENCOUNTER — Other Ambulatory Visit: Payer: Medicare Other

## 2015-06-29 ENCOUNTER — Inpatient Hospital Stay: Payer: Medicare Other | Attending: Internal Medicine

## 2015-06-29 ENCOUNTER — Inpatient Hospital Stay: Payer: Medicare Other

## 2015-07-11 ENCOUNTER — Other Ambulatory Visit: Payer: Self-pay | Admitting: Internal Medicine

## 2015-08-10 ENCOUNTER — Inpatient Hospital Stay: Payer: Medicare Other | Attending: Internal Medicine

## 2015-08-10 ENCOUNTER — Inpatient Hospital Stay: Payer: Medicare Other

## 2015-08-10 ENCOUNTER — Inpatient Hospital Stay (HOSPITAL_BASED_OUTPATIENT_CLINIC_OR_DEPARTMENT_OTHER): Payer: Medicare Other | Admitting: Internal Medicine

## 2015-08-10 VITALS — BP 113/69 | HR 83 | Temp 97.9°F | Resp 18 | Wt 154.0 lb

## 2015-08-10 DIAGNOSIS — Z7982 Long term (current) use of aspirin: Secondary | ICD-10-CM

## 2015-08-10 DIAGNOSIS — Z79899 Other long term (current) drug therapy: Secondary | ICD-10-CM | POA: Insufficient documentation

## 2015-08-10 DIAGNOSIS — D45 Polycythemia vera: Secondary | ICD-10-CM

## 2015-08-10 LAB — COMPREHENSIVE METABOLIC PANEL
ALBUMIN: 4.1 g/dL (ref 3.5–5.0)
ALT: 34 U/L (ref 14–54)
ANION GAP: 4 — AB (ref 5–15)
AST: 41 U/L (ref 15–41)
Alkaline Phosphatase: 145 U/L — ABNORMAL HIGH (ref 38–126)
BUN: 24 mg/dL — ABNORMAL HIGH (ref 6–20)
CALCIUM: 8.8 mg/dL — AB (ref 8.9–10.3)
CHLORIDE: 104 mmol/L (ref 101–111)
CO2: 27 mmol/L (ref 22–32)
Creatinine, Ser: 1.14 mg/dL — ABNORMAL HIGH (ref 0.44–1.00)
GFR calc Af Amer: 47 mL/min — ABNORMAL LOW (ref >60)
GFR calc non Af Amer: 40 mL/min — ABNORMAL LOW (ref >60)
GLUCOSE: 111 mg/dL — AB (ref 65–99)
POTASSIUM: 4 mmol/L (ref 3.5–5.1)
Sodium: 135 mmol/L (ref 135–145)
Total Bilirubin: 1 mg/dL (ref 0.3–1.2)
Total Protein: 8.8 g/dL — ABNORMAL HIGH (ref 6.5–8.1)

## 2015-08-10 LAB — CBC WITH DIFFERENTIAL/PLATELET
Basophils Absolute: 0 10*3/uL (ref 0–0.1)
Basophils Relative: 0 %
Eosinophils Absolute: 0.1 10*3/uL (ref 0–0.7)
Eosinophils Relative: 2 %
HCT: 43.8 % (ref 35.0–47.0)
Hemoglobin: 14.5 g/dL (ref 12.0–16.0)
Lymphocytes Relative: 24 %
Lymphs Abs: 1.9 10*3/uL (ref 1.0–3.6)
MCH: 33.5 pg (ref 26.0–34.0)
MCHC: 33.1 g/dL (ref 32.0–36.0)
MCV: 101.2 fL — ABNORMAL HIGH (ref 80.0–100.0)
Monocytes Absolute: 0.9 10*3/uL (ref 0.2–0.9)
Monocytes Relative: 11 %
Neutro Abs: 5.1 10*3/uL (ref 1.4–6.5)
Neutrophils Relative %: 63 %
Platelets: 390 10*3/uL (ref 150–440)
RBC: 4.33 MIL/uL (ref 3.80–5.20)
RDW: 15.4 % — ABNORMAL HIGH (ref 11.5–14.5)
WBC: 8 10*3/uL (ref 3.6–11.0)

## 2015-08-10 NOTE — Progress Notes (Signed)
[place] OFFICE PROGRESS NOTE  Patient Care Team: Einar Pheasant, MD as PCP - General (Internal Medicine)  HPI  SUMMARY of HEMATOLOGIC HISTORY:  # POLYCYTHEMIA VERA s/p BMBx- MAY 2010- hypercellular; no blasts; LOW EPO;JAK-2 POSITIVE on Hydrea 7 pill/week; Asa/d   INTERVAL HISTORY:  A very pleasant 82 year old female patient with above history of polycythemia vera on Hydrea/periodic phlebotomies is here for follow-up.   Patient denies any unusual headaches or vision changes or double vision. She continues to be an aspirin. No bleeding issues. No burning pain in the fingertips and toes.   REVIEW OF SYSTEMS: No weight loss night sweats or fevers. No skin changes or oral ulcers. No A complete 10 point review of system is done which is negative except mentioned above in history of present illness  I have reviewed the past medical history, past surgical history, social history and family history with the patient and they are unchanged from previous note unless stated above.  ALLERGIES:  is allergic to penicillins and sulfa antibiotics.  MEDICATIONS:  Current Outpatient Prescriptions  Medication Sig Dispense Refill  . acetaminophen (TYLENOL) 500 MG tablet Take 500 mg by mouth as needed for pain.    Marland Kitchen amLODipine (NORVASC) 10 MG tablet TAKE 1 TABLET DAILY 90 tablet 0  . aspirin 81 MG tablet Take 81 mg by mouth daily.    Marland Kitchen atorvastatin (LIPITOR) 40 MG tablet TAKE 1 TABLET DAILY 90 tablet 2  . azelastine (ASTELIN) 137 MCG/SPRAY nasal spray Place 1 spray into the nose 2 (two) times daily. Use in each nostril as directed    . fexofenadine (ALLEGRA) 180 MG tablet Take 180 mg by mouth daily.    . fluticasone (FLONASE) 50 MCG/ACT nasal spray USE 2 SPRAYS EACH NOSTRIL ONE TIME DAILY 16 g 5  . hydrochlorothiazide (HYDRODIURIL) 25 MG tablet TAKE 1 TABLET DAILY 90 tablet 1  . hydroxyurea (HYDREA) 500 MG capsule 500 mg. 1 capsule daily except on Tuesdays when patient takes 2  capsules.    Marland Kitchen lisinopril (PRINIVIL,ZESTRIL) 40 MG tablet Take 1 tablet (40 mg total) by mouth daily. NEEDS APPT FOR FURTHER REFILLS 90 tablet 0  . Multiple Vitamin (MULTIVITAMIN) tablet Take 1 tablet by mouth daily.     No current facility-administered medications for this visit.    PHYSICAL EXAMINATION:   LMP 06/10/1958  There were no vitals filed for this visit.  GENERAL: Well-nourished well-developed; Alert, no distress and comfortable.   Accompanied by a friend. Patient is resting in a wheelchair. EYES: no pallor or icterus OROPHARYNX: no thrush or ulceration; good dentition  NECK: supple, no masses felt LYMPH:  no palpable lymphadenopathy in the cervical, axillary or inguinal regions LUNGS: clear to auscultation and  No wheeze or crackles HEART/CVS: regular rate & rhythm and positive for murmurs; No lower extremity edema ABDOMEN:abdomen soft, non-tender and normal bowel sounds Musculoskeletal:no cyanosis of digits and no clubbing  PSYCH: alert & oriented x 3 with fluent speech NEURO: no focal motor/sensory deficits SKIN:  no rashes or significant lesions   LABORATORY DATA:  I have reviewed the data as listed    Component Value Date/Time   NA 136 05/11/2015 1346   K 3.6 05/11/2015 1346   CL 103 05/11/2015 1346   CO2 27 05/11/2015 1346   GLUCOSE 205* 05/11/2015 1346   BUN 20 05/11/2015 1346   CREATININE 1.04* 05/11/2015 1346   CALCIUM 8.7* 05/11/2015 1346   PROT 7.8 05/11/2015 1346   ALBUMIN 3.9 05/11/2015 1346  AST 33 05/11/2015 1346   ALT 23 05/11/2015 1346   ALKPHOS 123 05/11/2015 1346   BILITOT 0.8 05/11/2015 1346   GFRNONAA 45* 05/11/2015 1346   GFRAA 52* 05/11/2015 1346    No results found for: SPEP, UPEP  Lab Results  Component Value Date   WBC 8.0 08/10/2015   NEUTROABS 5.1 08/10/2015   HGB 14.5 08/10/2015   HCT 43.8 08/10/2015   MCV 101.2* 08/10/2015   PLT 390 08/10/2015      Chemistry      Component Value Date/Time   NA 136 05/11/2015  1346   K 3.6 05/11/2015 1346   CL 103 05/11/2015 1346   CO2 27 05/11/2015 1346   BUN 20 05/11/2015 1346   CREATININE 1.04* 05/11/2015 1346      Component Value Date/Time   CALCIUM 8.7* 05/11/2015 1346   ALKPHOS 123 05/11/2015 1346   AST 33 05/11/2015 1346   ALT 23 05/11/2015 1346   BILITOT 0.8 05/11/2015 1346      ASSESSMENT & PLAN:  Polycythemia vera- currently asymptomatic; on Hydrea 500 mg once a day; except on Tuesdays when she takes 2 pills a day. Today the hematocrit is  43.8; plan phlebotomy if greater than 44. HOLD phlebotomy today. In general patient gets a phlebotomy 1 50 mL.   Patient tolerating Hydrea well without any major side effects; continued aspirin 81 mg a day.  # H&H/possible phlebotomy in 3 months follow-up with me/phlebotmy in 6 months with CBC and BMP.  15 minutes counseling the patient face to face. The total time spent in the appointment was 30 minutes and more than 50% was on counseling and review of test results     Cammie Sickle, MD 08/10/2015 1:33 PM

## 2015-08-18 ENCOUNTER — Other Ambulatory Visit: Payer: Self-pay | Admitting: *Deleted

## 2015-08-18 MED ORDER — HYDROXYUREA 500 MG PO CAPS: ORAL_CAPSULE | ORAL | Status: DC

## 2015-09-04 ENCOUNTER — Other Ambulatory Visit: Payer: Self-pay | Admitting: Internal Medicine

## 2015-09-13 ENCOUNTER — Other Ambulatory Visit: Payer: Self-pay | Admitting: Internal Medicine

## 2015-09-13 NOTE — Telephone Encounter (Signed)
Patient has labs in chart from 1/17 but no OV since 4/16 ok to fill medication?

## 2015-09-14 NOTE — Telephone Encounter (Signed)
Needs f/u appt scheduled.  Once scheduled, ok to refill until appt.

## 2015-09-15 ENCOUNTER — Other Ambulatory Visit: Payer: Self-pay | Admitting: *Deleted

## 2015-09-15 MED ORDER — AMLODIPINE BESYLATE 10 MG PO TABS: 10.0000 mg | ORAL_TABLET | Freq: Every day | ORAL | Status: DC

## 2015-09-15 NOTE — Telephone Encounter (Signed)
I tried to reach pt by phone & as I was leaving a message someone picked up & hung up the phone to disconnect the call. I mailed pt a letter & let her know that I sent a 30 day supply to [facility]. Once she make an appt & is seen, we can send it back to [facility] for 90 day supply.

## 2015-09-15 NOTE — Telephone Encounter (Signed)
Tried to reach pt & notify her that I have sent in a 30 day supply to [facility] until she is seen. Once she is seen, we can send a 90 day supply to [facility].

## 2015-10-10 ENCOUNTER — Other Ambulatory Visit: Payer: Self-pay | Admitting: Internal Medicine

## 2015-10-10 NOTE — Telephone Encounter (Signed)
Received refill electronically Last office visit 08/19/14 Has appointment scheduled for 12/29/15 Okay to fill until the appointment?

## 2015-10-11 ENCOUNTER — Other Ambulatory Visit: Payer: Self-pay | Admitting: Internal Medicine

## 2015-10-11 NOTE — Telephone Encounter (Signed)
Since had labs through oncology, ok to refill medication until appt.  Make sure pt doing ok and notify her has to keep appt.

## 2015-11-09 ENCOUNTER — Inpatient Hospital Stay: Payer: Medicare Other | Attending: Internal Medicine

## 2015-11-09 ENCOUNTER — Inpatient Hospital Stay: Payer: Medicare Other

## 2015-11-09 DIAGNOSIS — D45 Polycythemia vera: Secondary | ICD-10-CM | POA: Diagnosis present

## 2015-11-09 LAB — HEMATOCRIT: HCT: 42.2 % (ref 35.0–47.0)

## 2015-11-09 LAB — HEMOGLOBIN: Hemoglobin: 14.4 g/dL (ref 12.0–16.0)

## 2015-12-29 ENCOUNTER — Ambulatory Visit (INDEPENDENT_AMBULATORY_CARE_PROVIDER_SITE_OTHER): Payer: Medicare Other | Admitting: Internal Medicine

## 2015-12-29 ENCOUNTER — Encounter: Payer: Self-pay | Admitting: Internal Medicine

## 2015-12-29 VITALS — BP 130/62 | HR 92 | Temp 98.3°F | Resp 16 | Ht 64.0 in | Wt 150.0 lb

## 2015-12-29 DIAGNOSIS — D45 Polycythemia vera: Secondary | ICD-10-CM | POA: Diagnosis not present

## 2015-12-29 DIAGNOSIS — Z0000 Encounter for general adult medical examination without abnormal findings: Secondary | ICD-10-CM

## 2015-12-29 DIAGNOSIS — I10 Essential (primary) hypertension: Secondary | ICD-10-CM

## 2015-12-29 DIAGNOSIS — M1990 Unspecified osteoarthritis, unspecified site: Secondary | ICD-10-CM

## 2015-12-29 DIAGNOSIS — E7800 Pure hypercholesterolemia, unspecified: Secondary | ICD-10-CM | POA: Diagnosis not present

## 2015-12-29 NOTE — Progress Notes (Signed)
Patient ID: Tina Bond, female   DOB: 1920/10/21, 82 y.o.   MRN: [id number]   Subjective:    Patient ID: Tina Bond, female    DOB: 1920-09-19, 82 y.o.   MRN: [id number]  HPI  Patient here for her physical exam.  She did not want to get up on the table.  Unable to do a physical/breast exam.  She states she is doing well.  Feels good.  No chest pain.  No sob.  No acid reflux.  No abdominal pain or cramping.  Bowels stable.  Overall she feels she is doing well.  Has someone to help her around her house.  Does not feel she needs anything more at this time.     Past Medical History:  Diagnosis Date  . Coronary artery disease    Status post heart cath with two insignificant vessel blockages, patient chose medical management  . History of Clostridium difficile infection    After antibiotics of a dental procedure  . History of degenerative disc disease   . Hypertension   . Osteoarthritis   . Polycythemia vera(238.4)     GWK (Hydrea)  . Pure hypercholesterolemia    Past Surgical History:  Procedure Laterality Date  . ABDOMINAL HYSTERECTOMY  1970   ovaries left in place  . APPENDECTOMY     performed with hysterectomy  . BONE MARROW BIOPSY  08/2008  . CATARACT EXTRACTION  03/06/11   . CHOLECYSTECTOMY     Family History  Problem Relation Age of Onset  . Hypertension Mother   . Cancer Neg Hx     No colon or breast cancer   Social History   Social History  . Marital status: Married    Spouse name: Tina Bond  . Number of children: 2  . Years of education: N/A   Social History Main Topics  . Smoking status: Never Smoker  . Smokeless tobacco: Never Used  . Alcohol use No  . Drug use: No  . Sexual activity: Not Asked   Other Topics Concern  . None   Social History Narrative  . None    Outpatient Encounter Prescriptions as of 12/29/2015  Medication Sig  . acetaminophen (TYLENOL) 500 MG tablet Take 500 mg by mouth as needed for pain.  Marland Kitchen amLODipine (NORVASC) 10 MG  tablet Take 1 tablet (10 mg total) by mouth daily.  Marland Kitchen aspirin 81 MG tablet Take 81 mg by mouth daily.  Marland Kitchen atorvastatin (LIPITOR) 40 MG tablet TAKE 1 TABLET DAILY  . azelastine (ASTELIN) 137 MCG/SPRAY nasal spray Place 1 spray into the nose 2 (two) times daily. Use in each nostril as directed  . fexofenadine (ALLEGRA) 180 MG tablet Take 180 mg by mouth daily.  . fluticasone (FLONASE) 50 MCG/ACT nasal spray USE 2 SPRAYS EACH NOSTRIL ONE TIME DAILY  . hydrochlorothiazide (HYDRODIURIL) 25 MG tablet TAKE 1 TABLET DAILY  . hydroxyurea (HYDREA) 500 MG capsule 1 capsule daily except on Tuesdays when patient takes 2 capsules.  Marland Kitchen lisinopril (PRINIVIL,ZESTRIL) 40 MG tablet TAKE 1 TABLET DAILY (NEED APPOINTMENT FOR FURTHER REFILLS)  . Multiple Vitamin (MULTIVITAMIN) tablet Take 1 tablet by mouth daily.   No facility-administered encounter medications on file as of 12/29/2015.     Review of Systems  Constitutional: Negative for appetite change and unexpected weight change.  HENT: Negative for congestion and sinus pressure.   Eyes: Negative for pain and visual disturbance.  Respiratory: Negative for cough, chest tightness and shortness of breath.   Cardiovascular: Negative  for chest pain, palpitations and leg swelling.  Gastrointestinal: Negative for abdominal pain, diarrhea, nausea and vomiting.  Genitourinary: Negative for difficulty urinating and dysuria.  Musculoskeletal: Negative for joint swelling and myalgias.  Skin: Negative for color change and rash.  Neurological: Negative for dizziness, light-headedness and headaches.  Hematological: Negative for adenopathy. Does not bruise/bleed easily.  Psychiatric/Behavioral: Negative for agitation and dysphoric mood.       Objective:     Blood pressure 130/62 Pulse regular.   Physical Exam  Constitutional: She appears well-developed and well-nourished. No distress.  HENT:  Nose: Nose normal.  Mouth/Throat: Oropharynx is clear and moist.  Eyes:  Conjunctivae are normal. Right eye exhibits no discharge. Left eye exhibits no discharge.  Neck: Neck supple. No thyromegaly present.  Cardiovascular: Normal rate and regular rhythm.   Pulmonary/Chest: Breath sounds normal. No respiratory distress. She has no wheezes.  Abdominal: Soft. Bowel sounds are normal. There is no tenderness.  Musculoskeletal: She exhibits no edema or tenderness.  Lymphadenopathy:    She has no cervical adenopathy.  Skin: No rash noted. No erythema.  Psychiatric: She has a normal mood and affect. Her behavior is normal.    BP 130/62   Pulse 92 Comment: irregular  Temp 98.3 F (36.8 C) (Oral)   Resp 16   Ht _0  (1.626 m) Comment: per pt; refused to measure height  Wt 150 lb (68 kg) Comment: per pt; refused to weigh  LMP 06/10/1958   BMI 25.75 kg/m  Wt Readings from Last 3 Encounters:  12/29/15 150 lb (68 kg)  08/10/15 154 lb (69.9 kg)  05/11/15 154 lb (69.9 kg)     Lab Results  Component Value Date   WBC 8.0 08/10/2015   HGB 14.4 11/09/2015   HCT 42.2 11/09/2015   PLT 390 08/10/2015   GLUCOSE 111 (H) 08/10/2015   CHOL 117 12/15/2013   TRIG 113.0 12/15/2013   HDL 37.90 (L) 12/15/2013   LDLCALC 57 12/15/2013   ALT 34 08/10/2015   AST 41 08/10/2015   NA 135 08/10/2015   K 4.0 08/10/2015   CL 104 08/10/2015   CREATININE 1.14 (H) 08/10/2015   BUN 24 (H) 08/10/2015   CO2 27 08/10/2015   TSH 2.58 08/19/2014       Assessment & Plan:   Problem List Items Addressed This Visit    Arthritis    Uses tylenol prn.  Follow.  Stable.        Essential hypertension, benign    Blood pressure under good control.  Continue same medication regimen.  Follow pressures.  Follow metabolic panel.        Healthcare maintenance    Physical today 12/29/15.  Declines mammogram.  No breast exam.        Hypercholesterolemia    On lipitor.  Low cholesterol diet and exercise.  Follow lipid panel and liver function tests.        Polycythemia vera ([place])     Followed by hematology.  Just evaluated.  Stable.  See note.        Other Visit Diagnoses   None.      Tina Pheasant, MD

## 2015-12-31 ENCOUNTER — Encounter: Payer: Self-pay | Admitting: Internal Medicine

## 2015-12-31 DIAGNOSIS — Z0000 Encounter for general adult medical examination without abnormal findings: Secondary | ICD-10-CM | POA: Insufficient documentation

## 2015-12-31 NOTE — Assessment & Plan Note (Signed)
On lipitor.  Low cholesterol diet and exercise.  Follow lipid panel and liver function tests.   

## 2015-12-31 NOTE — Assessment & Plan Note (Signed)
Uses tylenol prn.  Follow.  Stable.

## 2015-12-31 NOTE — Assessment & Plan Note (Signed)
Followed by hematology.  Just evaluated.  Stable.  See note.

## 2015-12-31 NOTE — Assessment & Plan Note (Signed)
Blood pressure under good control.  Continue same medication regimen.  Follow pressures.  Follow metabolic panel.   

## 2015-12-31 NOTE — Assessment & Plan Note (Signed)
Physical today 12/29/15.  Declines mammogram.  No breast exam.

## 2016-01-09 ENCOUNTER — Other Ambulatory Visit: Payer: Self-pay | Admitting: Internal Medicine

## 2016-01-11 ENCOUNTER — Other Ambulatory Visit: Payer: Medicare Other

## 2016-01-12 ENCOUNTER — Other Ambulatory Visit: Payer: Medicare Other

## 2016-01-17 ENCOUNTER — Other Ambulatory Visit: Payer: Medicare Other

## 2016-01-19 ENCOUNTER — Other Ambulatory Visit: Payer: Self-pay | Admitting: Internal Medicine

## 2016-01-20 ENCOUNTER — Telehealth: Payer: Self-pay | Admitting: *Deleted

## 2016-01-20 DIAGNOSIS — E7800 Pure hypercholesterolemia, unspecified: Secondary | ICD-10-CM

## 2016-01-20 DIAGNOSIS — R748 Abnormal levels of other serum enzymes: Secondary | ICD-10-CM

## 2016-01-20 NOTE — Telephone Encounter (Signed)
Pt coming in for labs on Wed. No orders placed from our office.

## 2016-01-21 NOTE — Telephone Encounter (Signed)
Orders placed for labs

## 2016-01-24 ENCOUNTER — Other Ambulatory Visit: Payer: Medicare Other

## 2016-01-25 ENCOUNTER — Other Ambulatory Visit: Payer: Medicare Other

## 2016-01-31 ENCOUNTER — Telehealth: Payer: Self-pay | Admitting: *Deleted

## 2016-01-31 NOTE — Telephone Encounter (Signed)
Please call pt to consult the proper medication she should be taking for her blood pressure  Pt contact [phone]

## 2016-01-31 NOTE — Telephone Encounter (Signed)
Pt has requested to have a copy of her medication list, she would also like to know which medication is for her blood pressure Pt contact [phone]

## 2016-01-31 NOTE — Telephone Encounter (Signed)
Patient notified and medication list sent to patient

## 2016-02-08 ENCOUNTER — Encounter: Payer: Self-pay | Admitting: Hematology and Oncology

## 2016-02-08 ENCOUNTER — Inpatient Hospital Stay: Payer: Medicare Other | Attending: Hematology and Oncology

## 2016-02-08 ENCOUNTER — Inpatient Hospital Stay: Payer: Medicare Other

## 2016-02-08 ENCOUNTER — Inpatient Hospital Stay (HOSPITAL_BASED_OUTPATIENT_CLINIC_OR_DEPARTMENT_OTHER): Payer: Medicare Other | Admitting: Hematology and Oncology

## 2016-02-08 DIAGNOSIS — Z8619 Personal history of other infectious and parasitic diseases: Secondary | ICD-10-CM

## 2016-02-08 DIAGNOSIS — I10 Essential (primary) hypertension: Secondary | ICD-10-CM | POA: Insufficient documentation

## 2016-02-08 DIAGNOSIS — D45 Polycythemia vera: Secondary | ICD-10-CM | POA: Insufficient documentation

## 2016-02-08 DIAGNOSIS — I2510 Atherosclerotic heart disease of native coronary artery without angina pectoris: Secondary | ICD-10-CM | POA: Diagnosis not present

## 2016-02-08 DIAGNOSIS — M1990 Unspecified osteoarthritis, unspecified site: Secondary | ICD-10-CM | POA: Insufficient documentation

## 2016-02-08 DIAGNOSIS — E7800 Pure hypercholesterolemia, unspecified: Secondary | ICD-10-CM | POA: Diagnosis not present

## 2016-02-08 DIAGNOSIS — Z7982 Long term (current) use of aspirin: Secondary | ICD-10-CM | POA: Insufficient documentation

## 2016-02-08 DIAGNOSIS — Z79899 Other long term (current) drug therapy: Secondary | ICD-10-CM

## 2016-02-08 LAB — BASIC METABOLIC PANEL
Anion gap: 8 (ref 5–15)
BUN: 21 mg/dL — AB (ref 6–20)
CHLORIDE: 103 mmol/L (ref 101–111)
CO2: 25 mmol/L (ref 22–32)
Calcium: 8.9 mg/dL (ref 8.9–10.3)
Creatinine, Ser: 1.07 mg/dL — ABNORMAL HIGH (ref 0.44–1.00)
GFR calc Af Amer: 50 mL/min — ABNORMAL LOW (ref >60)
GFR, EST NON AFRICAN AMERICAN: 43 mL/min — AB (ref >60)
Glucose, Bld: 152 mg/dL — ABNORMAL HIGH (ref 65–99)
Potassium: 3.5 mmol/L (ref 3.5–5.1)
SODIUM: 136 mmol/L (ref 135–145)

## 2016-02-08 LAB — CBC WITH DIFFERENTIAL/PLATELET
Basophils Absolute: 0.2 10*3/uL — ABNORMAL HIGH (ref 0–0.1)
Basophils Relative: 2 %
EOS ABS: 0.2 10*3/uL (ref 0–0.7)
Eosinophils Relative: 3 %
HEMATOCRIT: 41.4 % (ref 35.0–47.0)
HEMOGLOBIN: 14.2 g/dL (ref 12.0–16.0)
LYMPHS PCT: 25 %
Lymphs Abs: 1.8 10*3/uL (ref 1.0–3.6)
MCH: 35.3 pg — AB (ref 26.0–34.0)
MCHC: 34.2 g/dL (ref 32.0–36.0)
MCV: 103.1 fL — AB (ref 80.0–100.0)
MONOS PCT: 11 %
Monocytes Absolute: 0.7 10*3/uL (ref 0.2–0.9)
NEUTROS PCT: 59 %
Neutro Abs: 4.2 10*3/uL (ref 1.4–6.5)
Platelets: 320 10*3/uL (ref 150–440)
RBC: 4.02 MIL/uL (ref 3.80–5.20)
RDW: 15.7 % — ABNORMAL HIGH (ref 11.5–14.5)
WBC: 7 10*3/uL (ref 3.6–11.0)

## 2016-02-08 NOTE — Assessment & Plan Note (Signed)
She is doing well I recommend she continue same dose hydroxyurea along with aspirin therapy. No need phlebotomy. She will return in 6 months with repeat blood work history and physical examination

## 2016-02-08 NOTE — Progress Notes (Signed)
[place] FOLLOW-UP progress notes  Patient Care Team: Einar Pheasant, MD as PCP - General (Internal Medicine)  CHIEF COMPLAINTS/PURPOSE OF VISIT:  Polycythemia vera  HISTORY OF PRESENTING ILLNESS:  Tina Bond 82 y.o. female was transferred to my care after her prior physician is not available.  I reviewed the patient's records extensive and collaborated the history with the patient. Summary of her history is as follows: This patient has background history of polycythemia vera, diagnosed around 2010, JAK 2 positive. She is currently taking hydroxyurea, 500 mg daily except on Tuesdays she takes 1000 mg, along with aspirin therapy. Since she was last seen here, she has been feeling well. Denies recent diagnosis of blood clot, heart attack or stroke. She is compliant taking her medications as directed.  MEDICAL HISTORY:  Past Medical History:  Diagnosis Date  . Coronary artery disease    Status post heart cath with two insignificant vessel blockages, patient chose medical management  . History of Clostridium difficile infection    After antibiotics of a dental procedure  . History of degenerative disc disease   . Hypertension   . Osteoarthritis   . Polycythemia vera(238.4)     GWK (Hydrea)  . Pure hypercholesterolemia     SURGICAL HISTORY: Past Surgical History:  Procedure Laterality Date  . ABDOMINAL HYSTERECTOMY  1970   ovaries left in place  . APPENDECTOMY     performed with hysterectomy  . BONE MARROW BIOPSY  08/2008  . CATARACT EXTRACTION  03/06/11   . CHOLECYSTECTOMY      SOCIAL HISTORY: Social History   Social History  . Marital status: Married    Spouse name: Delrae Alfred  . Number of children: 2  . Years of education: N/A   Occupational History  . Not on file.   Social History Main Topics  . Smoking status: Never Smoker  . Smokeless tobacco: Never Used  . Alcohol use No  . Drug use: No  . Sexual activity: Not on file   Other Topics  Concern  . Not on file   Social History Narrative  . No narrative on file    FAMILY HISTORY: Family History  Problem Relation Age of Onset  . Hypertension Mother   . Cancer Neg Hx     No colon or breast cancer    ALLERGIES:  is allergic to penicillins and sulfa antibiotics.  MEDICATIONS:  Current Outpatient Prescriptions  Medication Sig Dispense Refill  . acetaminophen (TYLENOL) 500 MG tablet Take 500 mg by mouth as needed for pain.    Marland Kitchen amLODipine (NORVASC) 10 MG tablet Take 1 tablet (10 mg total) by mouth daily. 30 tablet 0  . aspirin 81 MG tablet Take 81 mg by mouth daily.    Marland Kitchen atorvastatin (LIPITOR) 40 MG tablet TAKE 1 TABLET DAILY 90 tablet 0  . azelastine (ASTELIN) 137 MCG/SPRAY nasal spray Place 1 spray into the nose 2 (two) times daily. Use in each nostril as directed    . fexofenadine (ALLEGRA) 180 MG tablet Take 180 mg by mouth daily.    . fluticasone (FLONASE) 50 MCG/ACT nasal spray USE 2 SPRAYS EACH NOSTRIL ONE TIME DAILY 16 g 5  . hydrochlorothiazide (HYDRODIURIL) 25 MG tablet TAKE 1 TABLET DAILY 90 tablet 3  . hydroxyurea (HYDREA) 500 MG capsule TAKE 1 CAPSULE BY MOUTH DAILY EXCEPT ON TUESDAYS WHEN PATIENT TAKES 2CAPSULES 35 capsule 1  . lisinopril (PRINIVIL,ZESTRIL) 40 MG tablet TAKE 1 TABLET DAILY (NEED APPOINTMENT FOR FURTHER REFILLS) 90  tablet 0  . Multiple Vitamin (MULTIVITAMIN) tablet Take 1 tablet by mouth daily.     No current facility-administered medications for this visit.     REVIEW OF SYSTEMS:   Constitutional: Denies fevers, chills or abnormal night sweats Eyes: Denies blurriness of vision, double vision or watery eyes Ears, nose, mouth, throat, and face: Denies mucositis or sore throat Respiratory: Denies cough, dyspnea or wheezes Cardiovascular: Denies palpitation, chest discomfort or lower extremity swelling Gastrointestinal:  Denies nausea, heartburn or change in bowel habits Skin: Denies abnormal skin rashes Lymphatics: Denies new  lymphadenopathy or easy bruising Neurological:Denies numbness, tingling or new weaknesses Behavioral/Psych: Mood is stable, no new changes  All other systems were reviewed with the patient and are negative.  PHYSICAL EXAMINATION: ECOG PERFORMANCE STATUS: 1 - Symptomatic but completely ambulatory  Vitals:   02/08/16 1356  BP: 99/62  Pulse: 74  Resp: 18  Temp: 97 F (36.1 C)   Filed Weights   02/08/16 1356  Weight: 150 lb (68 kg)    GENERAL:alert, no distress and comfortable SKIN: skin color, texture, turgor are normal, no rashes or significant lesions EYES: normal, conjunctiva are pink and non-injected, sclera clear OROPHARYNX:no exudate, normal lips, buccal mucosa, and tongue  NECK: supple, thyroid normal size, non-tender, without nodularity LYMPH:  no palpable lymphadenopathy in the cervical, axillary or inguinal LUNGS: clear to auscultation and percussion with normal breathing effort HEART: regular rate & rhythm and no murmurs without lower extremity edema ABDOMEN:abdomen soft, non-tender and normal bowel sounds Musculoskeletal:no cyanosis of digits and no clubbing  PSYCH: alert & oriented x 3 with fluent speech NEURO: no focal motor/sensory deficits  LABORATORY DATA:  I have reviewed the data as listed Lab Results  Component Value Date   WBC 7.0 02/08/2016   HGB 14.2 02/08/2016   HCT 41.4 02/08/2016   MCV 103.1 (H) 02/08/2016   PLT 320 02/08/2016    Recent Labs  05/11/15 1346 08/10/15 1317 02/08/16 1320  NA 136 135 136  K 3.6 4.0 3.5  CL 103 104 103  CO2 _0 GLUCOSE 205* 111* 152*  BUN 20 24* 21*  CREATININE 1.04* 1.14* 1.07*  CALCIUM 8.7* 8.8* 8.9  GFRNONAA 45* 40* 43*  GFRAA 52* 47* 50*  PROT 7.8 8.8*  --   ALBUMIN 3.9 4.1  --   AST 33 41  --   ALT 23 34  --   ALKPHOS 123 145*  --   BILITOT 0.8 1.0  --    Polycythemia vera (HCC) She is doing well I recommend she continue same dose hydroxyurea along with aspirin therapy. No need  phlebotomy. She will return in 6 months with repeat blood work history and physical examination   No orders of the defined types were placed in this encounter.   All questions were answered. The patient knows to call the clinic with any problems, questions or concerns. I spent 15 minutes counseling the patient face to face. The total time spent in the appointment was 20 minutes and more than 50% was on counseling.     Heath Lark, MD 02/08/2016 2:09 PM

## 2016-02-27 ENCOUNTER — Other Ambulatory Visit: Payer: Self-pay | Admitting: Internal Medicine

## 2016-04-08 ENCOUNTER — Other Ambulatory Visit: Payer: Self-pay | Admitting: Internal Medicine

## 2016-04-10 ENCOUNTER — Telehealth: Payer: Self-pay

## 2016-04-10 NOTE — Telephone Encounter (Signed)
Last OV 12/29/15. Lisinopril and Lipitor both filled 01/09/16. Message attached to both saying pt needed OV for further fills. I don't see a recent Lipid panel for patient. Last completed lipid panel documented is from 2015. Last metabolic panel was completed by another office. Kidney function was slightly off. Please advise on both refills. Pt has no lab appt scheduled and no follow up OV until 06/28/16.

## 2016-04-10 NOTE — Telephone Encounter (Signed)
Need to schedule fasting lab appt then can refill x 1 only.  Needs to come in for appt.

## 2016-04-10 NOTE — Telephone Encounter (Signed)
Attempted to call pt to schedule lab appointment. When pt calls back we need to schedule her for fasting labs. She must have labs done to get any more refills on her medications.

## 2016-04-10 NOTE — Telephone Encounter (Signed)
Attempted to call patient. Someone else answered the phone and said they will leave a message for patient to call back and schedule lab appointment. I have refilled with no further refills until labs are drawn.

## 2016-04-16 ENCOUNTER — Other Ambulatory Visit: Payer: Self-pay | Admitting: Internal Medicine

## 2016-05-11 ENCOUNTER — Telehealth: Payer: Self-pay | Admitting: *Deleted

## 2016-05-11 NOTE — Telephone Encounter (Signed)
Would like me to schedule patient to come in to discuss this?

## 2016-05-11 NOTE — Telephone Encounter (Signed)
Patient's daughter has requested to have a physical therapist come to the home to help pt with strengthen of the legs. Patient was seen at a walk in on 01/12 for fall. Pt is currently having trouble with her right leg weakness, that causes her to loose balance and fall. No injuries to report , pt was advise by the walk in physician to use a heating pad and take tylenol. Please contact Bethena Roys to discuss matter [phone]  Daughter is willing gto bring pt in for an appt, however she lives in [place] and over time it has become difficult to transport her mother due to leg weakness.

## 2016-05-11 NOTE — Telephone Encounter (Signed)
Spoke with patients daughter and she stated that the right leg weakness has been going on for about a week. She is able to bear weight, but does favor leg. I told daughter that Caryl Pina would call to schedule appointment.

## 2016-05-11 NOTE — Telephone Encounter (Signed)
Please call daughter and confirm that this leg weakness is not an acute issue.  We can get an appt scheduled for her, but just wanted to make sure nothing acute going on.  If ok for appt, please send to [facility] to schedule.

## 2016-05-12 NOTE — Telephone Encounter (Signed)
We will need to work in next week.  I will get with you to work in.

## 2016-05-14 NOTE — Telephone Encounter (Signed)
Spoke with patients daughter and she is fine with bringing her mother on 05/30/16. She ask if the was a cancellation for [place] to call. I told her that I would. She said that Wednesday and Thursday is better for her.

## 2016-05-14 NOTE — Telephone Encounter (Signed)
Called pt daughter with two different appt options for this week and they didn't work with her scheduled. Did schedule pt for 1/31. Pt daughter stated that she was told that pt didn't have to be present for the appt.. I told her that pt has to be present in order for the visit to be done

## 2016-05-22 ENCOUNTER — Telehealth: Payer: Self-pay | Admitting: Internal Medicine

## 2016-05-22 NOTE — Telephone Encounter (Signed)
Placed in red folder for completion.  

## 2016-05-22 NOTE — Telephone Encounter (Signed)
FL2 Forms dropped off for Assistant living. Gave forms to [place].

## 2016-05-23 NOTE — Telephone Encounter (Signed)
Do they need these forms now?  She has an appt on 05/30/16 with me for evaluation to complete forms.

## 2016-05-24 NOTE — Telephone Encounter (Signed)
Forms completed. Placed in box.

## 2016-05-24 NOTE — Telephone Encounter (Signed)
Pt daughter Early Osmond called and stated that Mom does not really need skilled nursing she needs someone to assist pt when using the walker to bathe, and put her on the toilet. Daughter wants to know if she really needs skilled nursing or assisted. Daughter also wants to know if they need to come in for the appt on 1/31. Please advise, thank you!  Call daughter at [phone].

## 2016-05-24 NOTE — Telephone Encounter (Signed)
Spoke with Bethena Roys daughter and patient has gotten TB skin done at [facility].

## 2016-05-24 NOTE — Telephone Encounter (Signed)
Patients daughter advised will be faxing FL 2 to [facility].  Advised daughter that patient was checked for assisted living by Dr Nicki Reaper. She will pick up original [place]  appointment 05/30/16. Advised daughter patient needs to keep appointment on 05/30/16 to follow up on chronic conditions.   Patients daughter verbalized understanding.

## 2016-05-24 NOTE — Telephone Encounter (Signed)
Spoke with Bethena Roys daughter for [facility] 2 completion Patient unable to bathe herself Needs help with dressing Uses walker to ambulate but needs assistance Bladder wears depends but not incontinent per daughter   Patient has fallen in last 2 weeks and has been evaluated at [facility] on [address].    Please advise daughter when fax to [facility]

## 2016-05-30 ENCOUNTER — Telehealth: Payer: Self-pay | Admitting: *Deleted

## 2016-05-30 ENCOUNTER — Encounter: Payer: Self-pay | Admitting: Internal Medicine

## 2016-05-30 ENCOUNTER — Ambulatory Visit (INDEPENDENT_AMBULATORY_CARE_PROVIDER_SITE_OTHER): Payer: Medicare Other | Admitting: Internal Medicine

## 2016-05-30 VITALS — BP 160/84 | HR 88 | Temp 98.6°F | Wt 150.8 lb

## 2016-05-30 DIAGNOSIS — Z0289 Encounter for other administrative examinations: Secondary | ICD-10-CM

## 2016-05-30 DIAGNOSIS — W19XXXA Unspecified fall, initial encounter: Secondary | ICD-10-CM

## 2016-05-30 DIAGNOSIS — Z7689 Persons encountering health services in other specified circumstances: Secondary | ICD-10-CM

## 2016-05-30 DIAGNOSIS — D45 Polycythemia vera: Secondary | ICD-10-CM | POA: Diagnosis not present

## 2016-05-30 DIAGNOSIS — E7800 Pure hypercholesterolemia, unspecified: Secondary | ICD-10-CM

## 2016-05-30 DIAGNOSIS — Z0000 Encounter for general adult medical examination without abnormal findings: Secondary | ICD-10-CM

## 2016-05-30 DIAGNOSIS — I10 Essential (primary) hypertension: Secondary | ICD-10-CM | POA: Diagnosis not present

## 2016-05-30 DIAGNOSIS — R748 Abnormal levels of other serum enzymes: Secondary | ICD-10-CM

## 2016-05-30 LAB — HEPATIC FUNCTION PANEL
ALT: 53 U/L — AB (ref 0–35)
AST: 74 U/L — AB (ref 0–37)
Albumin: 3.8 g/dL (ref 3.5–5.2)
Alkaline Phosphatase: 156 U/L — ABNORMAL HIGH (ref 39–117)
BILIRUBIN DIRECT: 0.1 mg/dL (ref 0.0–0.3)
Total Bilirubin: 0.7 mg/dL (ref 0.2–1.2)
Total Protein: 8.8 g/dL — ABNORMAL HIGH (ref 6.0–8.3)

## 2016-05-30 LAB — BASIC METABOLIC PANEL
BUN: 13 mg/dL (ref 6–23)
CALCIUM: 9.3 mg/dL (ref 8.4–10.5)
CO2: 30 meq/L (ref 19–32)
CREATININE: 0.85 mg/dL (ref 0.40–1.20)
Chloride: 103 mEq/L (ref 96–112)
GFR: 66.27 mL/min (ref >60.00)
GLUCOSE: 91 mg/dL (ref 70–99)
Potassium: 3.8 mEq/L (ref 3.5–5.1)
Sodium: 140 mEq/L (ref 135–145)

## 2016-05-30 LAB — TSH: TSH: 2.77 u[IU]/mL (ref 0.35–4.50)

## 2016-05-30 NOTE — Telephone Encounter (Signed)
Patient will need her TB Skin test results faxed over to [facility] per daughter  Fax [phone]

## 2016-05-30 NOTE — Progress Notes (Signed)
Pre-visit discussion using our clinic review tool. No additional management support is needed unless otherwise documented below in the visit note.  

## 2016-05-30 NOTE — Progress Notes (Signed)
Patient ID: Tina Bond, female   DOB: November 13, 1920, 83 y.o.   MRN: [id number]   Subjective:    Patient ID: Tina Bond, female    DOB: 12-26-1920, 83 y.o.   MRN: [id number]  HPI  Patient here as a work in to discuss placement.  She is accompanied by a caretaker and her daughter.  They report she has had problems with some leg weakness recently.  Has fallen previously.  No head injury.  States her leg just gives out at times. Tries to stay as active as possible.  She is eating.  No nausea or vomiting.  Bowels stable.  No abdominal pain reported.  They feel time has come to place in assisted living facility.  Discussed medications.  Discussed meds she is supposed to be taking and that she is actually taking.  Discussed form completion.     Past Medical History:  Diagnosis Date  . Coronary artery disease    Status post heart cath with two insignificant vessel blockages, patient chose medical management  . History of Clostridium difficile infection    After antibiotics of a dental procedure  . History of degenerative disc disease   . Hypertension   . Osteoarthritis   . Polycythemia vera(238.4)     GWK (Hydrea)  . Pure hypercholesterolemia    Past Surgical History:  Procedure Laterality Date  . ABDOMINAL HYSTERECTOMY  1970   ovaries left in place  . APPENDECTOMY     performed with hysterectomy  . BONE MARROW BIOPSY  08/2008  . CATARACT EXTRACTION  03/06/11   . CHOLECYSTECTOMY     Family History  Problem Relation Age of Onset  . Hypertension Mother   . Cancer Neg Hx     No colon or breast cancer   Social History   Social History  . Marital status: Married    Spouse name: Delrae Alfred  . Number of children: 2  . Years of education: N/A   Social History Main Topics  . Smoking status: Never Smoker  . Smokeless tobacco: Never Used  . Alcohol use No  . Drug use: No  . Sexual activity: Not Asked   Other Topics Concern  . None   Social History Narrative  . None     Outpatient Encounter Prescriptions as of 05/30/2016  Medication Sig  . acetaminophen (TYLENOL) 500 MG tablet Take 500 mg by mouth as needed for pain.  Marland Kitchen aspirin 81 MG tablet Take 81 mg by mouth daily.  Marland Kitchen atorvastatin (LIPITOR) 40 MG tablet Take 1 tablet (40 mg total) by mouth daily. MUST HAVE LABS FOR FURTHER REFILLS  . hydrochlorothiazide (HYDRODIURIL) 25 MG tablet TAKE 1 TABLET DAILY  . hydroxyurea (HYDREA) 500 MG capsule TAKE ONE CAPSULE BY MOUTH DAILY EXCEPT ON TUESDAY, TAKE TWO CAPSULES  . lisinopril (PRINIVIL,ZESTRIL) 40 MG tablet TAKE 1 TABLET DAILY. MUST HAVE LABS DRAWN FOR FURTHER REFILLS  . [DISCONTINUED] amLODipine (NORVASC) 10 MG tablet Take 1 tablet (10 mg total) by mouth daily.  . [DISCONTINUED] azelastine (ASTELIN) 137 MCG/SPRAY nasal spray Place 1 spray into the nose 2 (two) times daily. Use in each nostril as directed  . [DISCONTINUED] fexofenadine (ALLEGRA) 180 MG tablet Take 180 mg by mouth daily.  . [DISCONTINUED] fluticasone (FLONASE) 50 MCG/ACT nasal spray USE 2 SPRAYS EACH NOSTRIL ONE TIME DAILY  . [DISCONTINUED] Multiple Vitamin (MULTIVITAMIN) tablet Take 1 tablet by mouth daily.   No facility-administered encounter medications on file as of 05/30/2016.     Review of  Systems  Constitutional: Negative for appetite change and unexpected weight change.  HENT: Negative for congestion and sinus pressure.   Respiratory: Negative for cough, chest tightness and shortness of breath.   Cardiovascular: Negative for chest pain, palpitations and leg swelling.  Gastrointestinal: Negative for abdominal pain, diarrhea, nausea and vomiting.  Genitourinary: Negative for difficulty urinating and dysuria.  Musculoskeletal:       No pain from previous fall.  Some chronic back pain and arthritis pain.   Skin: Negative for color change and rash.  Neurological: Negative for dizziness, light-headedness and headaches.  Psychiatric/Behavioral: Negative for agitation and dysphoric mood.        Objective:    Physical Exam  Constitutional: She appears well-developed and well-nourished. No distress.  HENT:  Nose: Nose normal.  Mouth/Throat: Oropharynx is clear and moist.  Neck: Neck supple. No thyromegaly present.  Cardiovascular: Normal rate and regular rhythm.   Pulmonary/Chest: Breath sounds normal. No respiratory distress. She has no wheezes.  Abdominal: Soft. Bowel sounds are normal. There is no tenderness.  Musculoskeletal: She exhibits no edema or tenderness.  Lymphadenopathy:    She has no cervical adenopathy.  Skin: No rash noted. No erythema.  Psychiatric: She has a normal mood and affect. Her behavior is normal.    BP (!) 160/84 (BP Location: Left Arm, Patient Position: Sitting, Cuff Size: Large)   Pulse 88   Temp 98.6 F (37 C) (Oral)   Wt 150 lb 12.8 oz (68.4 kg)   LMP 06/10/1958   SpO2 99%   BMI 25.88 kg/m  Wt Readings from Last 3 Encounters:  05/30/16 150 lb 12.8 oz (68.4 kg)  02/08/16 150 lb (68 kg)  12/29/15 150 lb (68 kg)     Lab Results  Component Value Date   WBC 7.0 02/08/2016   HGB 14.2 02/08/2016   HCT 41.4 02/08/2016   PLT 320 02/08/2016   GLUCOSE 91 05/30/2016   CHOL 117 12/15/2013   TRIG 113.0 12/15/2013   HDL 37.90 (L) 12/15/2013   LDLCALC 57 12/15/2013   ALT 53 (H) 05/30/2016   AST 74 (H) 05/30/2016   NA 140 05/30/2016   K 3.8 05/30/2016   CL 103 05/30/2016   CREATININE 0.85 05/30/2016   BUN 13 05/30/2016   CO2 30 05/30/2016   TSH 2.77 05/30/2016       Assessment & Plan:   Problem List Items Addressed This Visit    Elevated alkaline phosphatase level    Recheck liver panel.        Essential hypertension, benign    Blood pressure on recheck improved.  Follow pressures.  Same medication regimen.  Follow pressures.  Follow metabolic panel.        Relevant Orders   TSH (Completed)   Basic metabolic panel (Completed)   Fall    Recent fall.  Feels as if leg gives away.  Strength appears to be equal  bilateral lower extremity.  Place in assisted living.  Physical therapy evaluation for treatment.        Healthcare maintenance - Primary   Hypercholesterolemia    On lipitor.  Low cholesterol diet and exercise.  Follow lipid panel and liver function tests.        Relevant Orders   Hepatic function panel (Completed)   Polycythemia vera ([place])    Sees hematology.  On hydroxyurea.  Stable.  Follow.        Other Visit Diagnoses    Encounter for completion of form with patient  Discussed medications and placement.  form completed.  discussed medications with pt and daughter.        I spent 40 minutes with the patient and more than 50% of the time was spent in consultation regarding the above.  Time spent discussing recent falls and feeling as if leg is giving away.  Intermittent.  Discussed placement and therapy.  Discussed medications.     Einar Pheasant, MD

## 2016-05-31 ENCOUNTER — Other Ambulatory Visit: Payer: Self-pay | Admitting: Internal Medicine

## 2016-05-31 DIAGNOSIS — R7989 Other specified abnormal findings of blood chemistry: Secondary | ICD-10-CM

## 2016-05-31 DIAGNOSIS — R945 Abnormal results of liver function studies: Secondary | ICD-10-CM

## 2016-05-31 NOTE — Progress Notes (Signed)
Orders placed for f/u labs.  

## 2016-05-31 NOTE — Telephone Encounter (Signed)
Tina Bond  from [facility] has requested only the TB ski test results. Please have this faxed to [phone]. If there are any questions please call [phone]

## 2016-06-01 NOTE — Telephone Encounter (Signed)
No PPD on file for pt. I have faxed office to let them know.

## 2016-06-03 ENCOUNTER — Encounter: Payer: Self-pay | Admitting: Internal Medicine

## 2016-06-03 DIAGNOSIS — W19XXXA Unspecified fall, initial encounter: Secondary | ICD-10-CM | POA: Insufficient documentation

## 2016-06-03 NOTE — Assessment & Plan Note (Signed)
Sees hematology.  On hydroxyurea.  Stable.  Follow.

## 2016-06-03 NOTE — Assessment & Plan Note (Signed)
Recheck liver panel.  

## 2016-06-03 NOTE — Assessment & Plan Note (Signed)
On lipitor.  Low cholesterol diet and exercise.  Follow lipid panel and liver function tests.   

## 2016-06-03 NOTE — Assessment & Plan Note (Signed)
Recent fall.  Feels as if leg gives away.  Strength appears to be equal bilateral lower extremity.  Place in assisted living.  Physical therapy evaluation for treatment.

## 2016-06-03 NOTE — Assessment & Plan Note (Signed)
Blood pressure on recheck improved.  Follow pressures.  Same medication regimen.  Follow pressures.  Follow metabolic panel.

## 2016-06-06 ENCOUNTER — Telehealth: Payer: Self-pay

## 2016-06-06 DIAGNOSIS — R945 Abnormal results of liver function studies: Secondary | ICD-10-CM

## 2016-06-06 DIAGNOSIS — R7989 Other specified abnormal findings of blood chemistry: Secondary | ICD-10-CM

## 2016-06-06 NOTE — Telephone Encounter (Signed)
I have placed the order for the abdominal ultrasound.  Regarding skilled nursing, there are certain criteria she would have to meet.  Would recommend seeing how she does at assisted living and this will allow for documentation  To see if needs higher level of care.

## 2016-06-06 NOTE — Telephone Encounter (Signed)
Per Dr. Nicki Reaper message:   Please call and notify pts daughter that Tina Bond liver function tests are elevated.  Unclear etiology. I want her to hold her atorvastatin (lipitor) for now. Schedule her for an abdominal ultrasound to better evaluate the liver. Recheck liver panel within one week to confirm stable. Thyroid test and kidney function tests are wnl. Let me know if agreeable to ultrasound and I will place the order.  Spoke to patients daughter she will bring her in on 06/07/16 for labs. She would like to go ahead with u/s she would like a Wednesday or Thursday that is when she is in town. She also would like to know if there is a way to get her mom in [place]. If she needs to be reevaluated for that?   I have called assisted living and gave verbal to hold Lipitor until further notice. She would like to have order faxed as well. I have in box for your to sign.

## 2016-06-06 NOTE — Telephone Encounter (Signed)
Pt daughter will be in dr app all day would like call back tomorrow.

## 2016-06-07 ENCOUNTER — Other Ambulatory Visit (INDEPENDENT_AMBULATORY_CARE_PROVIDER_SITE_OTHER): Payer: Medicare Other

## 2016-06-07 DIAGNOSIS — R7989 Other specified abnormal findings of blood chemistry: Secondary | ICD-10-CM | POA: Diagnosis not present

## 2016-06-07 DIAGNOSIS — R945 Abnormal results of liver function studies: Secondary | ICD-10-CM

## 2016-06-07 LAB — HEPATIC FUNCTION PANEL
ALT: 63 U/L — ABNORMAL HIGH (ref 0–35)
AST: 88 U/L — ABNORMAL HIGH (ref 0–37)
Albumin: 3.7 g/dL (ref 3.5–5.2)
Alkaline Phosphatase: 181 U/L — ABNORMAL HIGH (ref 39–117)
Bilirubin, Direct: 0.2 mg/dL (ref 0.0–0.3)
Total Bilirubin: 0.7 mg/dL (ref 0.2–1.2)
Total Protein: 8.9 g/dL — ABNORMAL HIGH (ref 6.0–8.3)

## 2016-06-07 LAB — AMYLASE: Amylase: 46 U/L (ref 27–131)

## 2016-06-07 LAB — LIPASE: LIPASE: 56 U/L (ref 11.0–59.0)

## 2016-06-07 NOTE — Telephone Encounter (Signed)
Pt daughter came by office wanted two things: 1 PT order to [facility] for gait and strength to help walking and mobility.  2. Was told that she could get abdominal u/s there can we send order.   Call daughter with information

## 2016-06-07 NOTE — Telephone Encounter (Signed)
[facility] (to my knowledge) cannot do an ultrasound at their facility.  I would think they should be able to take her to the appt and I have already ordered.  rx for PT placed in box.  Ok to fax.

## 2016-06-07 NOTE — Telephone Encounter (Signed)
N/a no v/m  

## 2016-06-08 ENCOUNTER — Telehealth: Payer: Self-pay | Admitting: Internal Medicine

## 2016-06-08 NOTE — Telephone Encounter (Signed)
Pt order faxed. Called [facility] l/m for care coordinator to call office so that we can find out if get u/s at their office.

## 2016-06-08 NOTE — Telephone Encounter (Signed)
Alex Gardener from [facility] called and would like pt's last office note form 1/31. Please advise, thank you!  Call pt @ [phone]   Fax # [phone]

## 2016-06-08 NOTE — Telephone Encounter (Signed)
Called office re faxed pt order to number provided [phone]

## 2016-06-08 NOTE — Telephone Encounter (Signed)
Tina Bond [id number] called from [facility] regarding wanting know if it's ok to do a start of care for home health on Monday? Please advise? She can take a verbal. Thank you!

## 2016-06-08 NOTE — Telephone Encounter (Signed)
Office note has been faxed electronically.

## 2016-06-12 NOTE — Telephone Encounter (Signed)
Spoke to Santiago Glad at [facility] they do have company that does U/S at their office we just need to send order. She was questioning why we are treating patient she is set up with doctors that come to their office?   Fax: [phone] att Santiago Glad

## 2016-06-13 ENCOUNTER — Telehealth: Payer: Self-pay | Admitting: *Deleted

## 2016-06-13 NOTE — Telephone Encounter (Signed)
Tina Bond from [facility] request frequency orders physical therapy 1 time a week for 1 week- 2 times a week for 3 weeks and 1 time a week for 1 week. She also suggested pain medication for arthritis possibly scheduled or PRN Contact Tina Bond [phone]

## 2016-06-13 NOTE — Telephone Encounter (Signed)
Spoke to [place] (Ms Odger's daughter) to clarify physician coverage.  Bethena Roys reports that it is easier to have the physicians at the facility see her mother.  Bethena Roys lives out of town and is hard to transport Ms Guevara to the office.  I explained that I understood and that the physicians at the facility would take over her care and explained that they would be giving orders.  Also explained they would need to f/u on the liver function tests, abdominal ultrasound and further testing and care.  Daughter in agreement.  Called [facility] and spoke to Reader.  Explained all of the above to [place].  [place] - planning to see her tomorrow at 10:00 and Butch Penny will forward the information regarding the abnormal liver function tests and need for ultrasound and further w/up to them.  Informed her to call me if any questions or problems.

## 2016-06-14 NOTE — Telephone Encounter (Signed)
Doctor's making house calls due to see pt today at 10:00.  They need to get orders for pain meds from them.  Since they are taking over care, future orders need to be through them.  Let me know if problems.

## 2016-06-14 NOTE — Telephone Encounter (Signed)
Amy [phone] called back from [facility] to follow up on PT order? Thank you!

## 2016-06-14 NOTE — Telephone Encounter (Signed)
Called not able to l/m voicemail not set up

## 2016-06-14 NOTE — Telephone Encounter (Signed)
Please advise 

## 2016-06-15 NOTE — Telephone Encounter (Signed)
Amy called back she will call them to get all orders.

## 2016-06-15 NOTE — Telephone Encounter (Signed)
Pt daughter called back to follow up on the last 2 blood work faxed to [facility].   Call daughter @ [phone]. Thank you!

## 2016-06-15 NOTE — Telephone Encounter (Signed)
Spoke to pt daughter have faxed from chart to [phone]

## 2016-06-15 NOTE — Telephone Encounter (Signed)
lmtrc

## 2016-06-28 ENCOUNTER — Ambulatory Visit: Payer: Medicare Other | Admitting: Internal Medicine

## 2016-07-10 ENCOUNTER — Other Ambulatory Visit: Payer: Self-pay | Admitting: Internal Medicine

## 2016-08-03 ENCOUNTER — Telehealth: Payer: Self-pay | Admitting: Internal Medicine

## 2016-08-03 NOTE — Telephone Encounter (Signed)
Please advise 

## 2016-08-03 NOTE — Telephone Encounter (Signed)
Social Security, representative payee, Tina Bond needs letter that she is not able to do her personal finances because she is in a nursing home, and that daughter states she not able to do her check book. Daughter has Power of Forensic psychologist but Social Security wants letter stating patient is not medically able to do her own finances. Daughter to bring copy on Wednesday of Power of attorney and copy of letter from [employer].

## 2016-08-03 NOTE — Telephone Encounter (Signed)
Pt daughter Bethena Roys called and stated that she needs a letter from Dr. Nicki Reaper stating that pt is unable to take care of her finances because of her medical conditions. Daughter need this ASAP because Social Security is looking to cancel payments . Please advise, thank you!  Call [facility] @ [phone]

## 2016-08-03 NOTE — Telephone Encounter (Signed)
Left message to call office

## 2016-08-03 NOTE — Telephone Encounter (Signed)
Need more information as to what is going on.  I do not understand the note.  Whether or not she is able to care for her finances should not have impact on them sending her a check (that I am aware of).  Just need more info.  May need to schedule office visit.

## 2016-08-06 NOTE — Telephone Encounter (Signed)
She needs letter by Wednesday afternoon if possible she will bring letter and Power of attorney Wednesday morning.

## 2016-08-06 NOTE — Telephone Encounter (Signed)
Does she need a letter before she brings info to the clinic?   If not, hold until info received.  Just let me know.  Thanks

## 2016-08-08 ENCOUNTER — Inpatient Hospital Stay: Payer: Medicare Other | Admitting: *Deleted

## 2016-08-08 ENCOUNTER — Inpatient Hospital Stay: Payer: Medicare Other | Attending: Internal Medicine

## 2016-08-08 ENCOUNTER — Inpatient Hospital Stay (HOSPITAL_BASED_OUTPATIENT_CLINIC_OR_DEPARTMENT_OTHER): Payer: Medicare Other | Admitting: Internal Medicine

## 2016-08-08 DIAGNOSIS — Z7982 Long term (current) use of aspirin: Secondary | ICD-10-CM | POA: Diagnosis not present

## 2016-08-08 DIAGNOSIS — Z79899 Other long term (current) drug therapy: Secondary | ICD-10-CM | POA: Diagnosis not present

## 2016-08-08 DIAGNOSIS — D45 Polycythemia vera: Secondary | ICD-10-CM

## 2016-08-08 LAB — BASIC METABOLIC PANEL
ANION GAP: 7 (ref 5–15)
BUN: 20 mg/dL (ref 6–20)
CO2: 28 mmol/L (ref 22–32)
Calcium: 9.2 mg/dL (ref 8.9–10.3)
Chloride: 96 mmol/L — ABNORMAL LOW (ref 101–111)
Creatinine, Ser: 0.85 mg/dL (ref 0.44–1.00)
GFR calc Af Amer: >60 mL/min (ref >60)
GFR calc non Af Amer: 57 mL/min — ABNORMAL LOW (ref >60)
Glucose, Bld: 101 mg/dL — ABNORMAL HIGH (ref 65–99)
POTASSIUM: 3.4 mmol/L — AB (ref 3.5–5.1)
Sodium: 131 mmol/L — ABNORMAL LOW (ref 135–145)

## 2016-08-08 LAB — CBC WITH DIFFERENTIAL/PLATELET
BASOS ABS: 0.1 10*3/uL (ref 0–0.1)
Basophils Relative: 1 %
EOS PCT: 0 %
Eosinophils Absolute: 0 10*3/uL (ref 0–0.7)
HCT: 42.4 % (ref 35.0–47.0)
Hemoglobin: 14.8 g/dL (ref 12.0–16.0)
LYMPHS PCT: 19 %
Lymphs Abs: 1.9 10*3/uL (ref 1.0–3.6)
MCH: 36.2 pg — AB (ref 26.0–34.0)
MCHC: 35 g/dL (ref 32.0–36.0)
MCV: 103.5 fL — ABNORMAL HIGH (ref 80.0–100.0)
MONO ABS: 1.6 10*3/uL — AB (ref 0.2–0.9)
Monocytes Relative: 16 %
Neutro Abs: 6.5 10*3/uL (ref 1.4–6.5)
Neutrophils Relative %: 64 %
Platelets: 309 10*3/uL (ref 150–440)
RBC: 4.1 MIL/uL (ref 3.80–5.20)
RDW: 19.1 % — ABNORMAL HIGH (ref 11.5–14.5)
WBC: 10.1 10*3/uL (ref 3.6–11.0)

## 2016-08-08 NOTE — Progress Notes (Signed)
Patient reside [facility]. Did not bring her MAR from facility. While patient was in the exam room, daughter contacted Nanine Means to request the facility mar to be faxed to [facility]. Daughter spoke with nurse and will fax mar to cancer ctr.  Patient states she only knows that she takes tylenol.

## 2016-08-08 NOTE — Assessment & Plan Note (Addendum)
Polycythemia vera- currently asymptomatic; on Hydrea 500 mg once a day; except on Tuesdays when she takes 2 pills a day. Patient tolerating Hydrea well without any major side effects; continued aspirin 81 mg a day.  # Today the hematocrit is  42.8; plan phlebotomy if greater than 44. HOLD phlebotomy today. In general patient gets a phlebotomy 150 mL.   # CBC/CMP/LDH/ in 6 m months/ possible phlebotomy.

## 2016-08-08 NOTE — Progress Notes (Signed)
[place] OFFICE PROGRESS NOTE  Patient Care Team: Einar Pheasant, MD as PCP - General (Internal Medicine)  HPI  SUMMARY of HEMATOLOGIC HISTORY:  # POLYCYTHEMIA VERA s/p BMBx- MAY 2010- hypercellular; no blasts; LOW EPO;JAK-2 POSITIVE on Hydrea 7 pill/week; Asa/d   INTERVAL HISTORY:  A very pleasant 83 year old female patient with above history of polycythemia vera on Hydrea/periodic phlebotomies is here for follow-up.   Patient continues to live in a nursing home. She is accompanied by daughter. Has not had any strokes or any blood clots. Patient continues to deny any unusual headaches or vision changes or double vision. She continues to be an aspirin. No bleeding issues. No burning pain in the fingertips and toes. She has not had any recent phlebotomies.   REVIEW OF SYSTEMS: No weight loss night sweats or fevers. No skin changes or oral ulcers. No A complete 10 point review of system is done which is negative except mentioned above in history of present illness  I have reviewed the past medical history, past surgical history, social history and family history with the patient and they are unchanged from previous note unless stated above.  ALLERGIES:  is allergic to penicillins and sulfa antibiotics.  MEDICATIONS:  Current Outpatient Prescriptions  Medication Sig Dispense Refill  . acetaminophen (TYLENOL) 500 MG tablet Take 500 mg by mouth as needed for pain.    Marland Kitchen aspirin 81 MG tablet Take 81 mg by mouth daily.    . hydrochlorothiazide (HYDRODIURIL) 25 MG tablet TAKE 1 TABLET DAILY 90 tablet 3  . hydroxyurea (HYDREA) 500 MG capsule TAKE ONE CAPSULE BY MOUTH DAILY EXCEPT ON TUESDAY, TAKE TWO CAPSULES 35 capsule 6  . Hypromellose (ARTIFICIAL TEARS) 0.4 % SOLN Apply 1 drop to eye daily at 12 noon.    . loratadine (CLARITIN) 10 MG tablet Take 10 mg by mouth daily.    . polyethylene glycol (MIRALAX / GLYCOLAX) packet Take 17 g by mouth daily.     No current  facility-administered medications for this visit.     PHYSICAL EXAMINATION:   BP 120/74 (BP Location: Left Arm, Patient Position: Sitting)   Pulse 84   Temp 100.3 F (37.9 C) (Tympanic)   Resp 16   Ht 5\' 4"  (1.626 m)   Wt 152 lb (68.9 kg)   LMP 06/10/1958   BMI 26.09 kg/m   Filed Weights   08/08/16 1343  Weight: 152 lb (68.9 kg)    GENERAL: Well-nourished well-developed; Alert, no distress and comfortable.   Accompanied By her daughter. Patient is resting in a wheelchair. EYES: no pallor or icterus OROPHARYNX: no thrush or ulceration; good dentition  NECK: supple, no masses felt LYMPH:  no palpable lymphadenopathy in the cervical, axillary or inguinal regions LUNGS: clear to auscultation and  No wheeze or crackles HEART/CVS: regular rate & rhythm and positive for murmurs; No lower extremity edema ABDOMEN:abdomen soft, non-tender and normal bowel sounds Musculoskeletal:no cyanosis of digits and no clubbing  PSYCH: alert & oriented x 3 with fluent speech NEURO: no focal motor/sensory deficits SKIN:  no rashes or significant lesions   LABORATORY DATA:  I have reviewed the data as listed    Component Value Date/Time   NA 131 (L) 08/08/2016 1250   K 3.4 (L) 08/08/2016 1250   CL 96 (L) 08/08/2016 1250   CO2 28 08/08/2016 1250   GLUCOSE 101 (H) 08/08/2016 1250   BUN 20 08/08/2016 1250   CREATININE 0.85 08/08/2016 1250   CALCIUM 9.2 08/08/2016 1250  PROT 8.9 (H) 06/07/2016 1426   ALBUMIN 3.7 06/07/2016 1426   AST 88 (H) 06/07/2016 1426   ALT 63 (H) 06/07/2016 1426   ALKPHOS 181 (H) 06/07/2016 1426   BILITOT 0.7 06/07/2016 1426   GFRNONAA 57 (L) 08/08/2016 1250   GFRAA >60 08/08/2016 1250    No results found for: SPEP, UPEP  Lab Results  Component Value Date   WBC 10.1 08/08/2016   NEUTROABS 6.5 08/08/2016   HGB 14.8 08/08/2016   HCT 42.4 08/08/2016   MCV 103.5 (H) 08/08/2016   PLT 309 08/08/2016      Chemistry      Component Value Date/Time   NA 131  (L) 08/08/2016 1250   K 3.4 (L) 08/08/2016 1250   CL 96 (L) 08/08/2016 1250   CO2 28 08/08/2016 1250   BUN 20 08/08/2016 1250   CREATININE 0.85 08/08/2016 1250      Component Value Date/Time   CALCIUM 9.2 08/08/2016 1250   ALKPHOS 181 (H) 06/07/2016 1426   AST 88 (H) 06/07/2016 1426   ALT 63 (H) 06/07/2016 1426   BILITOT 0.7 06/07/2016 1426      ASSESSMENT & PLAN:  Polycythemia vera ([place]) Polycythemia vera- currently asymptomatic; on Hydrea 500 mg once a day; except on Tuesdays when she takes 2 pills a day. Patient tolerating Hydrea well without any major side effects; continued aspirin 81 mg a day.  # Today the hematocrit is  42.8; plan phlebotomy if greater than 44. HOLD phlebotomy today. In general patient gets a phlebotomy 150 mL.   # CBC/CMP/LDH/ in 6 m months/ possible phlebotomy.      Cammie Sickle, MD 08/08/2016 5:05 PM

## 2016-08-09 NOTE — Telephone Encounter (Signed)
I never received the information.  I do not mind doing a letter, but just need a little more information about what is needed.  Do you mind contacting pt and discussing with her.  Thanks.

## 2016-08-10 ENCOUNTER — Telehealth: Payer: Self-pay | Admitting: *Deleted

## 2016-08-10 NOTE — Telephone Encounter (Signed)
Left detailed message for patient to return call to office for letter support for patient . Need proof of Power of attorney.

## 2016-08-10 NOTE — Telephone Encounter (Signed)
I assume this is in reference to your phone call.  Let me know if I need to do anything more.  See previous phone messages.  Thanks

## 2016-08-10 NOTE — Telephone Encounter (Signed)
FyI: Patient's daughter stated that she will use the in house doctors of the assistant living facility, due to the pt having trouble wih transferring with a wheel chair . Please contact Bethena Roys [phone] with any questions.

## 2016-08-10 NOTE — Telephone Encounter (Signed)
FYI

## 2016-08-27 ENCOUNTER — Telehealth: Payer: Self-pay | Admitting: Internal Medicine

## 2016-08-27 NOTE — Telephone Encounter (Signed)
Left pt daughter message asking to call Ebony Hail back directly at [phone] to schedule AWV. Thanks!  Pt home number has been changed*

## 2016-08-28 NOTE — Telephone Encounter (Signed)
Pt living at [facility]. Does not need AWV.

## 2017-02-06 ENCOUNTER — Inpatient Hospital Stay: Payer: Medicare Other | Attending: Internal Medicine

## 2017-02-06 ENCOUNTER — Inpatient Hospital Stay (HOSPITAL_BASED_OUTPATIENT_CLINIC_OR_DEPARTMENT_OTHER): Payer: Medicare Other | Admitting: Internal Medicine

## 2017-02-06 ENCOUNTER — Inpatient Hospital Stay: Payer: Medicare Other

## 2017-02-06 ENCOUNTER — Other Ambulatory Visit: Payer: Self-pay

## 2017-02-06 VITALS — BP 155/76 | HR 76 | Temp 97.6°F | Resp 20 | Ht 64.0 in | Wt 155.0 lb

## 2017-02-06 VITALS — BP 159/71 | HR 80 | Resp 20

## 2017-02-06 DIAGNOSIS — Z7982 Long term (current) use of aspirin: Secondary | ICD-10-CM | POA: Diagnosis not present

## 2017-02-06 DIAGNOSIS — D45 Polycythemia vera: Secondary | ICD-10-CM | POA: Insufficient documentation

## 2017-02-06 DIAGNOSIS — Z79899 Other long term (current) drug therapy: Secondary | ICD-10-CM

## 2017-02-06 LAB — COMPREHENSIVE METABOLIC PANEL
ALT: 13 U/L — AB (ref 14–54)
AST: 21 U/L (ref 15–41)
Albumin: 4.1 g/dL (ref 3.5–5.0)
Alkaline Phosphatase: 102 U/L (ref 38–126)
Anion gap: 10 (ref 5–15)
BUN: 25 mg/dL — AB (ref 6–20)
CHLORIDE: 98 mmol/L — AB (ref 101–111)
CO2: 28 mmol/L (ref 22–32)
Calcium: 9.3 mg/dL (ref 8.9–10.3)
Creatinine, Ser: 0.92 mg/dL (ref 0.44–1.00)
GFR calc Af Amer: 60 mL/min — ABNORMAL LOW (ref >60)
GFR calc non Af Amer: 52 mL/min — ABNORMAL LOW (ref >60)
Glucose, Bld: 104 mg/dL — ABNORMAL HIGH (ref 65–99)
Potassium: 3.8 mmol/L (ref 3.5–5.1)
Sodium: 136 mmol/L (ref 135–145)
Total Bilirubin: 0.7 mg/dL (ref 0.3–1.2)
Total Protein: 8.3 g/dL — ABNORMAL HIGH (ref 6.5–8.1)

## 2017-02-06 LAB — CBC WITH DIFFERENTIAL/PLATELET
Basophils Absolute: 0.5 10*3/uL — ABNORMAL HIGH (ref 0–0.1)
Basophils Relative: 6 %
EOS PCT: 1 %
Eosinophils Absolute: 0.1 10*3/uL (ref 0–0.7)
HCT: 45.6 % (ref 35.0–47.0)
Hemoglobin: 15.3 g/dL (ref 12.0–16.0)
LYMPHS PCT: 18 %
Lymphs Abs: 1.4 10*3/uL (ref 1.0–3.6)
MCH: 36.6 pg — ABNORMAL HIGH (ref 26.0–34.0)
MCHC: 33.5 g/dL (ref 32.0–36.0)
MCV: 109.3 fL — AB (ref 80.0–100.0)
Monocytes Absolute: 0.6 10*3/uL (ref 0.2–0.9)
Monocytes Relative: 8 %
Neutro Abs: 5.1 10*3/uL (ref 1.4–6.5)
Neutrophils Relative %: 67 %
PLATELETS: 401 10*3/uL (ref 150–440)
RBC: 4.18 MIL/uL (ref 3.80–5.20)
RDW: 14.9 % — ABNORMAL HIGH (ref 11.5–14.5)
WBC: 7.8 10*3/uL (ref 3.6–11.0)

## 2017-02-06 LAB — LACTATE DEHYDROGENASE: LDH: 132 U/L (ref 98–192)

## 2017-02-06 NOTE — Assessment & Plan Note (Addendum)
Polycythemia vera-JAK-2 positive. Currently Asymptomatic; on Hydrea 500 mg once a day; except on Tuesdays when she takes 2 pills a day. Patient tolerating Hydrea well without any major side effects; continued aspirin 81 mg a day. No concerns for transformation to high grade process.   # Today the hematocrit is  45.6; plan phlebotomy if greater than 44. Proceed with phlebotomy today. In general patient gets a phlebotomy 150 mL.   # CBC/CMP/LDH/ in 6 m months/ possible phlebotomy.

## 2017-02-06 NOTE — Progress Notes (Signed)
Unable to reconcile pt's medications today. Patient lives at [facility] assisted [facility]. Patient did not have a MAR provided.  Md made aware. RN contact facility to obtain her MAR. [phone]).  I had to leave a vm for Lisa,RN to obtain pt's facility mar.

## 2017-02-06 NOTE — Progress Notes (Signed)
[place] OFFICE PROGRESS NOTE  Patient Care Team: Einar Pheasant, MD as PCP - General (Internal Medicine)  HPI  SUMMARY of HEMATOLOGIC HISTORY:  # POLYCYTHEMIA VERA s/p BMBx- MAY 2010- hypercellular; no blasts; LOW EPO;JAK-2 POSITIVE on Hydrea 7 pill/week; Asa/d   INTERVAL HISTORY:  A very pleasant 83 year old female patient with above history of polycythemia vera on Hydrea/periodic phlebotomies is here for follow-up.   Patient continues to live in a nursing home. Denies any headaches. Denies any skin changes or double vision. No strokes. Continues to be on aspirin/Hydrea.  She is accompanied by daughter/sister. No burning pain in the fingertips and toes.    REVIEW OF SYSTEMS: No weight loss night sweats or fevers. No skin changes or oral ulcers. No A complete 10 point review of system is done which is negative except mentioned above in history of present illness  I have reviewed the past medical history, past surgical history, social history and family history with the patient and they are unchanged from previous note unless stated above.  ALLERGIES:  is allergic to penicillins and sulfa antibiotics.  MEDICATIONS:  Current Outpatient Prescriptions  Medication Sig Dispense Refill  . acetaminophen (TYLENOL) 500 MG tablet Take 500 mg by mouth as needed for pain.    Marland Kitchen aspirin 81 MG tablet Take 81 mg by mouth daily.    . hydrochlorothiazide (HYDRODIURIL) 25 MG tablet TAKE 1 TABLET DAILY 90 tablet 3  . hydroxyurea (HYDREA) 500 MG capsule TAKE ONE CAPSULE BY MOUTH DAILY EXCEPT ON TUESDAY, TAKE TWO CAPSULES 35 capsule 6  . Hypromellose (ARTIFICIAL TEARS) 0.4 % SOLN Apply 1 drop to eye daily at 12 noon.    . loratadine (CLARITIN) 10 MG tablet Take 10 mg by mouth daily.    . polyethylene glycol (MIRALAX / GLYCOLAX) packet Take 17 g by mouth daily.     No current facility-administered medications for this visit.     PHYSICAL EXAMINATION:   BP (!) 155/76 (BP  Location: Right Arm, Patient Position: Sitting)   Pulse 76   Temp 97.6 F (36.4 C) (Tympanic)   Resp 20   Ht 5\' 4"  (1.626 m)   Wt 155 lb (70.3 kg)   LMP 06/10/1958   BMI 26.61 kg/m   Filed Weights   02/06/17 1335  Weight: 155 lb (70.3 kg)    GENERAL: Well-nourished well-developed; Alert, no distress and comfortable.   Accompanied By her daughter. Patient is resting in a wheelchair. EYES: no pallor or icterus OROPHARYNX: no thrush or ulceration; good dentition  NECK: supple, no masses felt LYMPH:  no palpable lymphadenopathy in the cervical, axillary or inguinal regions LUNGS: clear to auscultation and  No wheeze or crackles HEART/CVS: regular rate & rhythm and positive for murmurs; No lower extremity edema ABDOMEN:abdomen soft, non-tender and normal bowel sounds Musculoskeletal:no cyanosis of digits and no clubbing  PSYCH: alert & oriented x 3 with fluent speech NEURO: no focal motor/sensory deficits SKIN:  no rashes or significant lesions   LABORATORY DATA:  I have reviewed the data as listed    Component Value Date/Time   NA 136 02/06/2017 1319   K 3.8 02/06/2017 1319   CL 98 (L) 02/06/2017 1319   CO2 28 02/06/2017 1319   GLUCOSE 104 (H) 02/06/2017 1319   BUN 25 (H) 02/06/2017 1319   CREATININE 0.92 02/06/2017 1319   CALCIUM 9.3 02/06/2017 1319   PROT 8.3 (H) 02/06/2017 1319   ALBUMIN 4.1 02/06/2017 1319   AST 21 02/06/2017 1319  ALT 13 (L) 02/06/2017 1319   ALKPHOS 102 02/06/2017 1319   BILITOT 0.7 02/06/2017 1319   GFRNONAA 52 (L) 02/06/2017 1319   GFRAA 60 (L) 02/06/2017 1319    No results found for: SPEP, UPEP  Lab Results  Component Value Date   WBC 7.8 02/06/2017   NEUTROABS 5.1 02/06/2017   HGB 15.3 02/06/2017   HCT 45.6 02/06/2017   MCV 109.3 (H) 02/06/2017   PLT 401 02/06/2017      Chemistry      Component Value Date/Time   NA 136 02/06/2017 1319   K 3.8 02/06/2017 1319   CL 98 (L) 02/06/2017 1319   CO2 28 02/06/2017 1319   BUN 25 (H)  02/06/2017 1319   CREATININE 0.92 02/06/2017 1319      Component Value Date/Time   CALCIUM 9.3 02/06/2017 1319   ALKPHOS 102 02/06/2017 1319   AST 21 02/06/2017 1319   ALT 13 (L) 02/06/2017 1319   BILITOT 0.7 02/06/2017 1319      ASSESSMENT & PLAN:  Polycythemia vera ([place]) Polycythemia vera-JAK-2 positive. Currently Asymptomatic; on Hydrea 500 mg once a day; except on Tuesdays when she takes 2 pills a day. Patient tolerating Hydrea well without any major side effects; continued aspirin 81 mg a day. No concerns for transformation to high grade process.   # Today the hematocrit is  45.6; plan phlebotomy if greater than 44. Proceed with phlebotomy today. In general patient gets a phlebotomy 150 mL.   # CBC/CMP/LDH/ in 6 m months/ possible phlebotomy.      Cammie Sickle, MD 02/06/2017 1:58 PM

## 2017-07-24 ENCOUNTER — Ambulatory Visit: Payer: Medicare Other | Admitting: Internal Medicine

## 2017-07-24 ENCOUNTER — Other Ambulatory Visit: Payer: Medicare Other

## 2017-08-06 ENCOUNTER — Other Ambulatory Visit: Payer: Self-pay | Admitting: Internal Medicine

## 2017-08-06 DIAGNOSIS — D45 Polycythemia vera: Secondary | ICD-10-CM

## 2017-08-07 ENCOUNTER — Inpatient Hospital Stay (HOSPITAL_BASED_OUTPATIENT_CLINIC_OR_DEPARTMENT_OTHER): Payer: Medicare Other | Admitting: Internal Medicine

## 2017-08-07 ENCOUNTER — Ambulatory Visit: Payer: Medicare Other | Admitting: Internal Medicine

## 2017-08-07 ENCOUNTER — Inpatient Hospital Stay: Payer: Medicare Other | Attending: Internal Medicine

## 2017-08-07 ENCOUNTER — Other Ambulatory Visit: Payer: Medicare Other

## 2017-08-07 ENCOUNTER — Other Ambulatory Visit: Payer: Self-pay

## 2017-08-07 ENCOUNTER — Inpatient Hospital Stay: Payer: Medicare Other

## 2017-08-07 VITALS — BP 186/90 | HR 74 | Temp 99.0°F | Resp 20 | Ht 64.0 in

## 2017-08-07 DIAGNOSIS — Z79899 Other long term (current) drug therapy: Secondary | ICD-10-CM

## 2017-08-07 DIAGNOSIS — Z7982 Long term (current) use of aspirin: Secondary | ICD-10-CM

## 2017-08-07 DIAGNOSIS — D45 Polycythemia vera: Secondary | ICD-10-CM

## 2017-08-07 LAB — CBC WITH DIFFERENTIAL/PLATELET
Basophils Absolute: 0.1 10*3/uL (ref 0–0.1)
Basophils Relative: 1 %
Eosinophils Absolute: 0.1 10*3/uL (ref 0–0.7)
Eosinophils Relative: 2 %
HCT: 40.7 % (ref 35.0–47.0)
Hemoglobin: 13.6 g/dL (ref 12.0–16.0)
Lymphocytes Relative: 28 %
Lymphs Abs: 1.8 10*3/uL (ref 1.0–3.6)
MCH: 35.7 pg — ABNORMAL HIGH (ref 26.0–34.0)
MCHC: 33.5 g/dL (ref 32.0–36.0)
MCV: 106.7 fL — ABNORMAL HIGH (ref 80.0–100.0)
MONO ABS: 0.6 10*3/uL (ref 0.2–0.9)
Monocytes Relative: 9 %
Neutro Abs: 3.9 10*3/uL (ref 1.4–6.5)
Neutrophils Relative %: 60 %
PLATELETS: 263 10*3/uL (ref 150–440)
RBC: 3.81 MIL/uL (ref 3.80–5.20)
RDW: 14.4 % (ref 11.5–14.5)
WBC: 6.5 10*3/uL (ref 3.6–11.0)

## 2017-08-07 LAB — COMPREHENSIVE METABOLIC PANEL
ALT: 13 U/L — AB (ref 14–54)
AST: 19 U/L (ref 15–41)
Albumin: 4 g/dL (ref 3.5–5.0)
Alkaline Phosphatase: 88 U/L (ref 38–126)
Anion gap: 8 (ref 5–15)
BILIRUBIN TOTAL: 0.7 mg/dL (ref 0.3–1.2)
BUN: 20 mg/dL (ref 6–20)
CO2: 27 mmol/L (ref 22–32)
CREATININE: 1.06 mg/dL — AB (ref 0.44–1.00)
Calcium: 9.5 mg/dL (ref 8.9–10.3)
Chloride: 102 mmol/L (ref 101–111)
GFR calc Af Amer: 50 mL/min — ABNORMAL LOW (ref >60)
GFR calc non Af Amer: 44 mL/min — ABNORMAL LOW (ref >60)
GLUCOSE: 105 mg/dL — AB (ref 65–99)
Potassium: 3.8 mmol/L (ref 3.5–5.1)
Sodium: 137 mmol/L (ref 135–145)
TOTAL PROTEIN: 8.2 g/dL — AB (ref 6.5–8.1)

## 2017-08-07 LAB — LACTATE DEHYDROGENASE: LDH: 127 U/L (ref 98–192)

## 2017-08-07 NOTE — Assessment & Plan Note (Addendum)
Polycythemia vera-JAK-2 positive. Currently Asymptomatic; on Hydrea 500 mg once a day; except on Tuesdays when she takes 2 pills a day.  #  Patient tolerating Hydrea well without any major side effects; continued aspirin 81 mg a day. No concerns for transformation to high grade process. Today- HCT- 40. NO phlebotomy today.   # CBC/CMP/LDH/ in 6 m months/ possible phlebotomy. No phlebotomy today.

## 2017-08-07 NOTE — Progress Notes (Signed)
[place] OFFICE PROGRESS NOTE  Patient Care Team: Einar Pheasant, MD as PCP - General (Internal Medicine)  HPI  SUMMARY of HEMATOLOGIC HISTORY:  # POLYCYTHEMIA VERA s/p BMBx- MAY 2010- hypercellular; no blasts; LOW EPO;JAK-2 POSITIVE on Hydrea 7 pill/week; Asa/d   # Hard of hearing  INTERVAL HISTORY:  A very pleasant 84 year old female patient with above history of polycythemia vera on Hydrea/periodic phlebotomies is here for follow-up.   She is accompanied by sisters. Patient continues to live in a nursing home. Denies any headaches. Denies any skin changes or double vision. No strokes. Continues to be on aspirin/Hydrea.  No burning pain in the fingertips and toes.    REVIEW OF SYSTEMS: No weight loss night sweats or fevers. No skin changes or oral ulcers. No A complete 10 point review of system is done which is negative except mentioned above in history of present illness  I have reviewed the past medical history, past surgical history, social history and family history with the patient and they are unchanged from previous note unless stated above.  ALLERGIES:  is allergic to penicillins and sulfa antibiotics.  MEDICATIONS:  Current Outpatient Medications  Medication Sig Dispense Refill  . acetaminophen (TYLENOL) 500 MG tablet Take 500 mg by mouth as needed for pain.    Marland Kitchen aspirin 81 MG tablet Take 81 mg by mouth daily.    . hydrochlorothiazide (HYDRODIURIL) 25 MG tablet TAKE 1 TABLET DAILY 90 tablet 3  . hydroxyurea (HYDREA) 500 MG capsule TAKE ONE CAPSULE BY MOUTH DAILY EXCEPT ON TUESDAY, TAKE TWO CAPSULES 35 capsule 6  . Hypromellose (ARTIFICIAL TEARS) 0.4 % SOLN Apply 1 drop to eye daily at 12 noon.    . loratadine (CLARITIN) 10 MG tablet Take 10 mg by mouth daily.     No current facility-administered medications for this visit.     PHYSICAL EXAMINATION:   BP (!) 186/90   Pulse 74   Temp 99 F (37.2 C) (Tympanic)   Resp 20   Ht 5\' 4"  (1.626 m)    LMP 06/10/1958   BMI 26.61 kg/m   Filed Weights    GENERAL: Well-nourished well-developed; Alert, no distress and comfortable.   Accompanied By her sisters.  Patient is resting in a wheelchair. EYES: no pallor or icterus OROPHARYNX: no thrush or ulceration; good dentition  NECK: supple, no masses felt LYMPH:  no palpable lymphadenopathy in the cervical, axillary or inguinal regions LUNGS: clear to auscultation and  No wheeze or crackles HEART/CVS: regular rate & rhythm and positive for murmurs; No lower extremity edema ABDOMEN:abdomen soft, non-tender and normal bowel sounds Musculoskeletal:no cyanosis of digits and no clubbing  PSYCH: alert & oriented x 3 with fluent speech NEURO: no focal motor/sensory deficits SKIN:  no rashes or significant lesions   LABORATORY DATA:  I have reviewed the data as listed    Component Value Date/Time   NA 137 08/07/2017 1020   K 3.8 08/07/2017 1020   CL 102 08/07/2017 1020   CO2 27 08/07/2017 1020   GLUCOSE 105 (H) 08/07/2017 1020   BUN 20 08/07/2017 1020   CREATININE 1.06 (H) 08/07/2017 1020   CALCIUM 9.5 08/07/2017 1020   PROT 8.2 (H) 08/07/2017 1020   ALBUMIN 4.0 08/07/2017 1020   AST 19 08/07/2017 1020   ALT 13 (L) 08/07/2017 1020   ALKPHOS 88 08/07/2017 1020   BILITOT 0.7 08/07/2017 1020   GFRNONAA 44 (L) 08/07/2017 1020   GFRAA 50 (L) 08/07/2017 1020  No results found for: SPEP, UPEP  Lab Results  Component Value Date   WBC 6.5 08/07/2017   NEUTROABS 3.9 08/07/2017   HGB 13.6 08/07/2017   HCT 40.7 08/07/2017   MCV 106.7 (H) 08/07/2017   PLT 263 08/07/2017      Chemistry      Component Value Date/Time   NA 137 08/07/2017 1020   K 3.8 08/07/2017 1020   CL 102 08/07/2017 1020   CO2 27 08/07/2017 1020   BUN 20 08/07/2017 1020   CREATININE 1.06 (H) 08/07/2017 1020      Component Value Date/Time   CALCIUM 9.5 08/07/2017 1020   ALKPHOS 88 08/07/2017 1020   AST 19 08/07/2017 1020   ALT 13 (L) 08/07/2017 1020    BILITOT 0.7 08/07/2017 1020      ASSESSMENT & PLAN:  Polycythemia vera ([place]) Polycythemia vera-JAK-2 positive. Currently Asymptomatic; on Hydrea 500 mg once a day; except on Tuesdays when she takes 2 pills a day.  #  Patient tolerating Hydrea well without any major side effects; continued aspirin 81 mg a day. No concerns for transformation to high grade process. Today- HCT- 40. NO phlebotomy today.   # CBC/CMP/LDH/ in 6 m months/ possible phlebotomy. No phlebotomy today.      Cammie Sickle, MD 08/07/2017 11:34 AM

## 2018-01-29 ENCOUNTER — Ambulatory Visit: Payer: Medicare Other | Admitting: Internal Medicine

## 2018-02-05 ENCOUNTER — Inpatient Hospital Stay: Payer: Medicare Other

## 2018-02-05 ENCOUNTER — Inpatient Hospital Stay: Payer: Medicare Other | Attending: Internal Medicine

## 2018-02-05 ENCOUNTER — Other Ambulatory Visit: Payer: Self-pay

## 2018-02-05 ENCOUNTER — Inpatient Hospital Stay (HOSPITAL_BASED_OUTPATIENT_CLINIC_OR_DEPARTMENT_OTHER): Payer: Medicare Other | Admitting: Internal Medicine

## 2018-02-05 VITALS — BP 147/77 | HR 77 | Temp 97.0°F | Resp 20

## 2018-02-05 DIAGNOSIS — D45 Polycythemia vera: Secondary | ICD-10-CM | POA: Diagnosis present

## 2018-02-05 DIAGNOSIS — Z79899 Other long term (current) drug therapy: Secondary | ICD-10-CM | POA: Insufficient documentation

## 2018-02-05 DIAGNOSIS — Z7982 Long term (current) use of aspirin: Secondary | ICD-10-CM

## 2018-02-05 DIAGNOSIS — D649 Anemia, unspecified: Secondary | ICD-10-CM

## 2018-02-05 LAB — CBC WITH DIFFERENTIAL/PLATELET
Abs Immature Granulocytes: 0.01 10*3/uL (ref 0.00–0.07)
BASOS PCT: 2 %
Basophils Absolute: 0.1 10*3/uL (ref 0.0–0.1)
Eosinophils Absolute: 0.1 10*3/uL (ref 0.0–0.5)
Eosinophils Relative: 2 %
HCT: 37.7 % (ref 36.0–46.0)
Hemoglobin: 11.4 g/dL — ABNORMAL LOW (ref 12.0–15.0)
Immature Granulocytes: 0 %
LYMPHS PCT: 30 %
Lymphs Abs: 1.9 10*3/uL (ref 0.7–4.0)
MCH: 31.1 pg (ref 26.0–34.0)
MCHC: 30.2 g/dL (ref 30.0–36.0)
MCV: 103 fL — ABNORMAL HIGH (ref 80.0–100.0)
Monocytes Absolute: 0.7 10*3/uL (ref 0.1–1.0)
Monocytes Relative: 11 %
NEUTROS ABS: 3.5 10*3/uL (ref 1.7–7.7)
Neutrophils Relative %: 55 %
PLATELETS: 396 10*3/uL (ref 150–400)
RBC: 3.66 MIL/uL — AB (ref 3.87–5.11)
RDW: 14.6 % (ref 11.5–15.5)
WBC: 6.4 10*3/uL (ref 4.0–10.5)
nRBC: 0 % (ref 0.0–0.2)

## 2018-02-05 LAB — COMPREHENSIVE METABOLIC PANEL
ALT: 11 U/L (ref 0–44)
AST: 21 U/L (ref 15–41)
Albumin: 3.9 g/dL (ref 3.5–5.0)
Alkaline Phosphatase: 87 U/L (ref 38–126)
Anion gap: 7 (ref 5–15)
BUN: 20 mg/dL (ref 8–23)
CHLORIDE: 103 mmol/L (ref 98–111)
CO2: 29 mmol/L (ref 22–32)
CREATININE: 1.03 mg/dL — AB (ref 0.44–1.00)
Calcium: 9.3 mg/dL (ref 8.9–10.3)
GFR calc Af Amer: 52 mL/min — ABNORMAL LOW (ref >60)
GFR calc non Af Amer: 45 mL/min — ABNORMAL LOW (ref >60)
Glucose, Bld: 104 mg/dL — ABNORMAL HIGH (ref 70–99)
Potassium: 3.2 mmol/L — ABNORMAL LOW (ref 3.5–5.1)
SODIUM: 139 mmol/L (ref 135–145)
Total Bilirubin: 0.6 mg/dL (ref 0.3–1.2)
Total Protein: 7.6 g/dL (ref 6.5–8.1)

## 2018-02-05 LAB — LACTATE DEHYDROGENASE: LDH: 130 U/L (ref 98–192)

## 2018-02-05 NOTE — Progress Notes (Signed)
[facility] OFFICE PROGRESS NOTE  Patient Care Team: Einar Pheasant, MD as PCP - General (Internal Medicine)  HPI  SUMMARY of HEMATOLOGIC HISTORY:  Oncology History   # POLYCYTHEMIA VERA s/p BMBx- MAY 2010- hypercellular; no blasts; LOW EPO;JAK-2 POSITIVE on Hydrea 7 pill/week; Asa/d   # Hard of hearing/ [facility]  DIAGNOSIS: POLYCYTHEMIA VERA        ;GOALS: control  CURRENT/MOST RECENT THERAPY ; hydrea      Polycythemia vera ([place])     INTERVAL HISTORY:  A very pleasant 84 year-old female patient with above history of polycythemia vera on Hydrea/periodic phlebotomies is here for follow-up.   Patient has not needed any phlebotomies in the last few years.   Patient denies any new onset of strokes.  Denies any burning pain in finger tips or toes.  She continues to live in a nursing home.   Review of Systems  Constitutional: Negative for chills, diaphoresis, fever, malaise/fatigue and weight loss.  HENT: Negative for nosebleeds and sore throat.        Chronic difficulty hearing.  Eyes: Negative for double vision.  Respiratory: Negative for cough, hemoptysis, sputum production, shortness of breath and wheezing.   Cardiovascular: Negative for chest pain, palpitations, orthopnea and leg swelling.  Gastrointestinal: Negative for abdominal pain, blood in stool, constipation, diarrhea, heartburn, melena, nausea and vomiting.  Genitourinary: Negative for dysuria, frequency and urgency.  Musculoskeletal:       Chronic joint pains.  Skin: Negative.  Negative for itching and rash.  Neurological: Negative for dizziness, tingling, focal weakness, weakness and headaches.  Endo/Heme/Allergies: Does not bruise/bleed easily.  Psychiatric/Behavioral: Negative for depression. The patient is not nervous/anxious and does not have insomnia.      I have reviewed the past medical history, past surgical history, social history and family history with the patient  and they are unchanged from previous note unless stated above.  ALLERGIES:  is allergic to penicillins and sulfa antibiotics.  MEDICATIONS:  Current Outpatient Medications  Medication Sig Dispense Refill  . acetaminophen (TYLENOL) 500 MG tablet Take 500 mg by mouth as needed for pain.    Marland Kitchen aspirin 81 MG tablet Take 81 mg by mouth daily.    . hydrochlorothiazide (HYDRODIURIL) 25 MG tablet TAKE 1 TABLET DAILY 90 tablet 3  . hydroxyurea (HYDREA) 500 MG capsule TAKE ONE CAPSULE BY MOUTH DAILY EXCEPT ON TUESDAY, TAKE TWO CAPSULES (Patient taking differently: 1 capsule by mouth one time a day every Sunday, Monday, Thursday, Fri and Saturday. Give 2 capsules by mouth one time every Tuesday.) 35 capsule 6  . Hypromellose (ARTIFICIAL TEARS) 0.4 % SOLN Apply 1 drop to eye daily at 12 noon.    . loratadine (CLARITIN) 10 MG tablet Take 10 mg by mouth daily.    . mineral oil external liquid Place 1 drop into both ears once a week. On Saturday for     No current facility-administered medications for this visit.     PHYSICAL EXAMINATION:   BP (!) 147/77   Pulse 77   Temp (!) 97 F (36.1 C) (Tympanic)   Resp 20   LMP 06/10/1958   There were no vitals filed for this visit.  Physical Exam  Constitutional: She is oriented to person, place, and time and well-developed, well-nourished, and in no distress.  Patient is alone.  She is in a wheelchair.  HENT:  Head: Normocephalic and atraumatic.  Mouth/Throat: Oropharynx is clear and moist. No oropharyngeal exudate.  Eyes: Pupils  are equal, round, and reactive to light.  Neck: Normal range of motion. Neck supple.  Cardiovascular: Normal rate and regular rhythm.  Pulmonary/Chest: No respiratory distress. She has no wheezes.  Abdominal: Soft. Bowel sounds are normal. She exhibits no distension and no mass. There is no tenderness. There is no rebound and no guarding.  Musculoskeletal: Normal range of motion. She exhibits no edema or tenderness.   Neurological: She is alert and oriented to person, place, and time.  Skin: Skin is warm.  Psychiatric: Affect normal.    LABORATORY DATA:  I have reviewed the data as listed    Component Value Date/Time   NA 139 02/05/2018 1010   K 3.2 (L) 02/05/2018 1010   CL 103 02/05/2018 1010   CO2 29 02/05/2018 1010   GLUCOSE 104 (H) 02/05/2018 1010   BUN 20 02/05/2018 1010   CREATININE 1.03 (H) 02/05/2018 1010   CALCIUM 9.3 02/05/2018 1010   PROT 7.6 02/05/2018 1010   ALBUMIN 3.9 02/05/2018 1010   AST 21 02/05/2018 1010   ALT 11 02/05/2018 1010   ALKPHOS 87 02/05/2018 1010   BILITOT 0.6 02/05/2018 1010   GFRNONAA 45 (L) 02/05/2018 1010   GFRAA 52 (L) 02/05/2018 1010    No results found for: SPEP, UPEP  Lab Results  Component Value Date   WBC 6.4 02/05/2018   NEUTROABS 3.5 02/05/2018   HGB 11.4 (L) 02/05/2018   HCT 37.7 02/05/2018   MCV 103.0 (H) 02/05/2018   PLT 396 02/05/2018      Chemistry      Component Value Date/Time   NA 139 02/05/2018 1010   K 3.2 (L) 02/05/2018 1010   CL 103 02/05/2018 1010   CO2 29 02/05/2018 1010   BUN 20 02/05/2018 1010   CREATININE 1.03 (H) 02/05/2018 1010      Component Value Date/Time   CALCIUM 9.3 02/05/2018 1010   ALKPHOS 87 02/05/2018 1010   AST 21 02/05/2018 1010   ALT 11 02/05/2018 1010   BILITOT 0.6 02/05/2018 1010      ASSESSMENT & PLAN:  Polycythemia vera ([place]) # Polycythemia vera-JAK-2 positive. Currently Asymptomatic;continue hydrea/asprin- STABLE   Pt currently on on Hydrea 500 mg once a day; except on Tuesdays-2 pills a day. Given Mild anemia- 11.2; recommend decrease Hydrea 500 mg once a day only.   # Mild Anemia- Hb 11.2; decrease the dose of hydrea as above; given her age patient is high risk for transformation to high-grade process like leukemia/secondary myelofibrosis.  LDH is normal.  DISPOSITION:  #  No phlebotomy today.  # CBC/CMP/LDH/ in 4 m months/ possible phlebotomy-Dr.B     Cammie Sickle,  MD 02/05/2018 11:04 AM

## 2018-02-05 NOTE — Assessment & Plan Note (Addendum)
#   Polycythemia vera-JAK-2 positive. Currently Asymptomatic;continue hydrea/asprin- STABLE   Pt currently on on Hydrea 500 mg once a day; except on Tuesdays-2 pills a day. Given Mild anemia- 11.2; recommend decrease Hydrea 500 mg once a day only.   # Mild Anemia- Hb 11.2; decrease the dose of hydrea as above; given her age patient is high risk for transformation to high-grade process like leukemia/secondary myelofibrosis.  LDH is normal.  DISPOSITION:  #  No phlebotomy today.  # CBC/CMP/LDH/ in 4 m months/ possible phlebotomy-Dr.B 

## 2018-06-10 ENCOUNTER — Other Ambulatory Visit: Payer: Self-pay

## 2018-06-10 DIAGNOSIS — D45 Polycythemia vera: Secondary | ICD-10-CM

## 2018-06-11 ENCOUNTER — Inpatient Hospital Stay: Payer: Medicare Other

## 2018-06-11 ENCOUNTER — Inpatient Hospital Stay: Payer: Medicare Other | Admitting: Internal Medicine

## 2018-06-17 ENCOUNTER — Inpatient Hospital Stay: Payer: Medicare Other

## 2018-06-17 ENCOUNTER — Inpatient Hospital Stay: Payer: Medicare Other | Admitting: Internal Medicine

## 2018-06-17 NOTE — Progress Notes (Unsigned)
[facility] OFFICE PROGRESS NOTE  Patient Care Team: Einar Pheasant, MD as PCP - General (Internal Medicine)  HPI  SUMMARY of HEMATOLOGIC HISTORY:  Oncology History   # POLYCYTHEMIA VERA s/p BMBx- MAY 2010- hypercellular; no blasts; LOW EPO;JAK-2 POSITIVE on Hydrea 7 pill/week; Asa/d   # Hard of hearing/ [facility]  DIAGNOSIS: POLYCYTHEMIA VERA        ;GOALS: control  CURRENT/MOST RECENT THERAPY ; hydrea      Polycythemia vera ([place])     INTERVAL HISTORY:  A very pleasant 85 year-old female patient with above history of polycythemia vera on Hydrea/periodic phlebotomies is here for follow-up.   Patient has not needed any phlebotomies in the last few years.   Patient denies any new onset of strokes.  Denies any burning pain in finger tips or toes.  She continues to live in a nursing home.   Review of Systems  Constitutional: Negative for chills, diaphoresis, fever, malaise/fatigue and weight loss.  HENT: Negative for nosebleeds and sore throat.        Chronic difficulty hearing.  Eyes: Negative for double vision.  Respiratory: Negative for cough, hemoptysis, sputum production, shortness of breath and wheezing.   Cardiovascular: Negative for chest pain, palpitations, orthopnea and leg swelling.  Gastrointestinal: Negative for abdominal pain, blood in stool, constipation, diarrhea, heartburn, melena, nausea and vomiting.  Genitourinary: Negative for dysuria, frequency and urgency.  Musculoskeletal:       Chronic joint pains.  Skin: Negative.  Negative for itching and rash.  Neurological: Negative for dizziness, tingling, focal weakness, weakness and headaches.  Endo/Heme/Allergies: Does not bruise/bleed easily.  Psychiatric/Behavioral: Negative for depression. The patient is not nervous/anxious and does not have insomnia.      I have reviewed the past medical history, past surgical history, social history and family history with the patient  and they are unchanged from previous note unless stated above.  ALLERGIES:  is allergic to penicillins and sulfa antibiotics.  MEDICATIONS:  Current Outpatient Medications  Medication Sig Dispense Refill  . acetaminophen (TYLENOL) 500 MG tablet Take 500 mg by mouth as needed for pain.    Marland Kitchen aspirin 81 MG tablet Take 81 mg by mouth daily.    . hydrochlorothiazide (HYDRODIURIL) 25 MG tablet TAKE 1 TABLET DAILY 90 tablet 3  . hydroxyurea (HYDREA) 500 MG capsule TAKE ONE CAPSULE BY MOUTH DAILY EXCEPT ON TUESDAY, TAKE TWO CAPSULES (Patient taking differently: 1 capsule by mouth one time a day every Sunday, Monday, Thursday, Fri and Saturday. Give 2 capsules by mouth one time every Tuesday.) 35 capsule 6  . Hypromellose (ARTIFICIAL TEARS) 0.4 % SOLN Apply 1 drop to eye daily at 12 noon.    . loratadine (CLARITIN) 10 MG tablet Take 10 mg by mouth daily.    . mineral oil external liquid Place 1 drop into both ears once a week. On Saturday for     No current facility-administered medications for this visit.     PHYSICAL EXAMINATION:   LMP 06/10/1958   There were no vitals filed for this visit.  Physical Exam  Constitutional: She is oriented to person, place, and time and well-developed, well-nourished, and in no distress.  Patient is alone.  She is in a wheelchair.  HENT:  Head: Normocephalic and atraumatic.  Mouth/Throat: Oropharynx is clear and moist. No oropharyngeal exudate.  Eyes: Pupils are equal, round, and reactive to light.  Neck: Normal range of motion. Neck supple.  Cardiovascular: Normal rate and regular rhythm.  Pulmonary/Chest: No respiratory distress. She has no wheezes.  Abdominal: Soft. Bowel sounds are normal. She exhibits no distension and no mass. There is no abdominal tenderness. There is no rebound and no guarding.  Musculoskeletal: Normal range of motion.        General: No tenderness or edema.  Neurological: She is alert and oriented to person, place, and time.   Skin: Skin is warm.  Psychiatric: Affect normal.    LABORATORY DATA:  I have reviewed the data as listed    Component Value Date/Time   NA 139 02/05/2018 1010   K 3.2 (L) 02/05/2018 1010   CL 103 02/05/2018 1010   CO2 29 02/05/2018 1010   GLUCOSE 104 (H) 02/05/2018 1010   BUN 20 02/05/2018 1010   CREATININE 1.03 (H) 02/05/2018 1010   CALCIUM 9.3 02/05/2018 1010   PROT 7.6 02/05/2018 1010   ALBUMIN 3.9 02/05/2018 1010   AST 21 02/05/2018 1010   ALT 11 02/05/2018 1010   ALKPHOS 87 02/05/2018 1010   BILITOT 0.6 02/05/2018 1010   GFRNONAA 45 (L) 02/05/2018 1010   GFRAA 52 (L) 02/05/2018 1010    No results found for: SPEP, UPEP  Lab Results  Component Value Date   WBC 6.4 02/05/2018   NEUTROABS 3.5 02/05/2018   HGB 11.4 (L) 02/05/2018   HCT 37.7 02/05/2018   MCV 103.0 (H) 02/05/2018   PLT 396 02/05/2018      Chemistry      Component Value Date/Time   NA 139 02/05/2018 1010   K 3.2 (L) 02/05/2018 1010   CL 103 02/05/2018 1010   CO2 29 02/05/2018 1010   BUN 20 02/05/2018 1010   CREATININE 1.03 (H) 02/05/2018 1010      Component Value Date/Time   CALCIUM 9.3 02/05/2018 1010   ALKPHOS 87 02/05/2018 1010   AST 21 02/05/2018 1010   ALT 11 02/05/2018 1010   BILITOT 0.6 02/05/2018 1010      ASSESSMENT & PLAN:  No problem-specific Assessment & Plan notes found for this encounter.     Cammie Sickle, MD 06/17/2018 8:54 AM

## 2018-06-17 NOTE — Assessment & Plan Note (Signed)
#   Polycythemia vera-JAK-2 positive. Currently Asymptomatic;continue hydrea/asprin- STABLE   Pt currently on on Hydrea 500 mg once a day; except on Tuesdays-2 pills a day. Given Mild anemia- 11.2; recommend decrease Hydrea 500 mg once a day only.   # Mild Anemia- Hb 11.2; decrease the dose of hydrea as above; given her age patient is high risk for transformation to high-grade process like leukemia/secondary myelofibrosis.  LDH is normal.  DISPOSITION:  #  No phlebotomy today.  # CBC/CMP/LDH/ in 4 m months/ possible phlebotomy-Dr.B

## 2018-06-23 ENCOUNTER — Ambulatory Visit: Payer: Medicare Other | Admitting: Internal Medicine

## 2018-06-23 ENCOUNTER — Other Ambulatory Visit: Payer: Medicare Other

## 2018-06-24 ENCOUNTER — Encounter: Payer: Self-pay | Admitting: Internal Medicine

## 2018-06-24 ENCOUNTER — Inpatient Hospital Stay: Payer: Medicare Other | Attending: Internal Medicine

## 2018-06-24 ENCOUNTER — Inpatient Hospital Stay: Payer: Medicare Other

## 2018-06-24 ENCOUNTER — Inpatient Hospital Stay (HOSPITAL_BASED_OUTPATIENT_CLINIC_OR_DEPARTMENT_OTHER): Payer: Medicare Other | Admitting: Internal Medicine

## 2018-06-24 VITALS — BP 154/76 | HR 80 | Temp 97.1°F | Resp 15

## 2018-06-24 DIAGNOSIS — R05 Cough: Secondary | ICD-10-CM | POA: Insufficient documentation

## 2018-06-24 DIAGNOSIS — Z7982 Long term (current) use of aspirin: Secondary | ICD-10-CM | POA: Diagnosis not present

## 2018-06-24 DIAGNOSIS — Z79899 Other long term (current) drug therapy: Secondary | ICD-10-CM | POA: Diagnosis not present

## 2018-06-24 DIAGNOSIS — D45 Polycythemia vera: Secondary | ICD-10-CM | POA: Insufficient documentation

## 2018-06-24 DIAGNOSIS — D649 Anemia, unspecified: Secondary | ICD-10-CM

## 2018-06-24 LAB — COMPREHENSIVE METABOLIC PANEL
ALT: 12 U/L (ref 0–44)
AST: 18 U/L (ref 15–41)
Albumin: 3.8 g/dL (ref 3.5–5.0)
Alkaline Phosphatase: 92 U/L (ref 38–126)
Anion gap: 8 (ref 5–15)
BUN: 16 mg/dL (ref 8–23)
CO2: 29 mmol/L (ref 22–32)
Calcium: 8.9 mg/dL (ref 8.9–10.3)
Chloride: 102 mmol/L (ref 98–111)
Creatinine, Ser: 0.87 mg/dL (ref 0.44–1.00)
GFR calc Af Amer: >60 mL/min (ref >60)
GFR calc non Af Amer: 57 mL/min — ABNORMAL LOW (ref >60)
Glucose, Bld: 115 mg/dL — ABNORMAL HIGH (ref 70–99)
Potassium: 3.3 mmol/L — ABNORMAL LOW (ref 3.5–5.1)
Sodium: 139 mmol/L (ref 135–145)
Total Bilirubin: 0.5 mg/dL (ref 0.3–1.2)
Total Protein: 8 g/dL (ref 6.5–8.1)

## 2018-06-24 LAB — CBC WITH DIFFERENTIAL/PLATELET
Abs Immature Granulocytes: 0.03 10*3/uL (ref 0.00–0.07)
Basophils Absolute: 0.2 10*3/uL — ABNORMAL HIGH (ref 0.0–0.1)
Basophils Relative: 2 %
Eosinophils Absolute: 0.2 10*3/uL (ref 0.0–0.5)
Eosinophils Relative: 3 %
HCT: 39 % (ref 36.0–46.0)
Hemoglobin: 11.6 g/dL — ABNORMAL LOW (ref 12.0–15.0)
Immature Granulocytes: 0 %
Lymphocytes Relative: 24 %
Lymphs Abs: 2 10*3/uL (ref 0.7–4.0)
MCH: 26.8 pg (ref 26.0–34.0)
MCHC: 29.7 g/dL — ABNORMAL LOW (ref 30.0–36.0)
MCV: 90.1 fL (ref 80.0–100.0)
Monocytes Absolute: 0.8 10*3/uL (ref 0.1–1.0)
Monocytes Relative: 9 %
Neutro Abs: 5.2 10*3/uL (ref 1.7–7.7)
Neutrophils Relative %: 62 %
Platelets: 687 10*3/uL — ABNORMAL HIGH (ref 150–400)
RBC: 4.33 MIL/uL (ref 3.87–5.11)
RDW: 16 % — ABNORMAL HIGH (ref 11.5–15.5)
WBC: 8.4 10*3/uL (ref 4.0–10.5)
nRBC: 0 % (ref 0.0–0.2)

## 2018-06-24 LAB — LACTATE DEHYDROGENASE: LDH: 143 U/L (ref 98–192)

## 2018-06-24 NOTE — Assessment & Plan Note (Addendum)
#   Polycythemia vera-JAK-2 positive. Currently Asymptomatic;continue hydrea/asprin-? Worsening.  Question progression.  Especially given mild anemia.  LDH is normal.  #Today Hb 11.6; platelets- 687.  Increase hydrea to once day M-F; Sat-sun- 2 pills/day.  Monitor for now if worsening would recommend ultrasound of the spleen.  Consider Jak 2 inhibitor.  # Coughing x 3 months- reluctant chest x-ray.  Recommend Prilosec once a day.  DISPOSITION:  #  No phlebotomy today.  # CBC/CMP/LDH/ in 4 months/-Dr.B

## 2018-06-24 NOTE — Progress Notes (Signed)
[place] OFFICE PROGRESS NOTE  Patient Care Team: Einar Pheasant, MD as PCP - General (Internal Medicine)  HPI  SUMMARY of HEMATOLOGIC HISTORY:  Oncology History   # POLYCYTHEMIA VERA s/p BMBx- MAY 2010- hypercellular; no blasts; LOW EPO;JAK-2 POSITIVE on Hydrea 7 pill/week; Asa/d   # Hard of hearing/ [facility]  DIAGNOSIS: POLYCYTHEMIA VERA        ;GOALS: control  CURRENT/MOST RECENT THERAPY ; hydrea      Polycythemia vera ([place])     INTERVAL HISTORY:  A very pleasant 85 year-old female patient with above history of polycythemia vera on Hydrea/periodic phlebotomies is here for follow-up.   Patient has not had needed phlebotomies for the last few years.  No new strokes.  No new thromboembolic events.  Patient complains of worsening cough for the last 2 to 3 months.  Complains of indigestion/reflux type symptoms.  She states that her chest x-ray at the nursing home.  No wheezing.  No significant weight loss.  Appetite is fair.   Review of Systems  Constitutional: Negative for chills, diaphoresis, fever, malaise/fatigue and weight loss.  HENT: Negative for nosebleeds and sore throat.        Chronic difficulty hearing.  Eyes: Negative for double vision.  Respiratory: Positive for cough. Negative for hemoptysis, sputum production, shortness of breath and wheezing.   Cardiovascular: Negative for chest pain, palpitations, orthopnea and leg swelling.  Gastrointestinal: Positive for heartburn. Negative for abdominal pain, blood in stool, constipation, diarrhea, melena, nausea and vomiting.  Genitourinary: Negative for dysuria, frequency and urgency.  Musculoskeletal:       Chronic joint pains.  Skin: Negative.  Negative for itching and rash.  Neurological: Negative for dizziness, tingling, focal weakness, weakness and headaches.  Endo/Heme/Allergies: Does not bruise/bleed easily.  Psychiatric/Behavioral: Negative for depression. The patient is  not nervous/anxious and does not have insomnia.      I have reviewed the past medical history, past surgical history, social history and family history with the patient and they are unchanged from previous note unless stated above.  ALLERGIES:  is allergic to penicillins and sulfa antibiotics.  MEDICATIONS:  Current Outpatient Medications  Medication Sig Dispense Refill  . acetaminophen (TYLENOL) 500 MG tablet Take 500 mg by mouth as needed for pain.    Marland Kitchen aspirin 81 MG tablet Take 81 mg by mouth daily.    . hydrochlorothiazide (HYDRODIURIL) 25 MG tablet TAKE 1 TABLET DAILY 90 tablet 3  . hydroxyurea (HYDREA) 500 MG capsule TAKE ONE CAPSULE BY MOUTH DAILY EXCEPT ON TUESDAY, TAKE TWO CAPSULES (Patient taking differently: 1 capsule by mouth one time a day every Sunday, Monday, Thursday, Fri and Saturday. Give 2 capsules by mouth one time every Tuesday.) 35 capsule 6  . Hypromellose (ARTIFICIAL TEARS) 0.4 % SOLN Apply 1 drop to eye daily at 12 noon.    . loratadine (CLARITIN) 10 MG tablet Take 10 mg by mouth daily.    . mineral oil external liquid Place 1 drop into both ears once a week. On Saturday for     No current facility-administered medications for this visit.     PHYSICAL EXAMINATION:   BP (!) 154/76 (BP Location: Left Arm, Patient Position: Sitting, Cuff Size: Normal)   Pulse 80   Temp (!) 97.1 F (36.2 C) (Tympanic)   Resp 15   LMP 06/10/1958   There were no vitals filed for this visit.  Physical Exam  Constitutional: She is oriented to person, place, and time  and well-developed, well-nourished, and in no distress.  Patient is alone.  She is in a wheelchair.  Accompanied by caregiver.  HENT:  Head: Normocephalic and atraumatic.  Mouth/Throat: Oropharynx is clear and moist. No oropharyngeal exudate.  Eyes: Pupils are equal, round, and reactive to light.  Neck: Normal range of motion. Neck supple.  Cardiovascular: Normal rate and regular rhythm.  Pulmonary/Chest: No  respiratory distress. She has no wheezes.  Abdominal: Soft. Bowel sounds are normal. She exhibits no distension and no mass. There is no abdominal tenderness. There is no rebound and no guarding.  Musculoskeletal: Normal range of motion.        General: No tenderness or edema.  Neurological: She is alert and oriented to person, place, and time.  Skin: Skin is warm.  Psychiatric: Affect normal.    LABORATORY DATA:  I have reviewed the data as listed    Component Value Date/Time   NA 139 06/24/2018 1345   K 3.3 (L) 06/24/2018 1345   CL 102 06/24/2018 1345   CO2 29 06/24/2018 1345   GLUCOSE 115 (H) 06/24/2018 1345   BUN 16 06/24/2018 1345   CREATININE 0.87 06/24/2018 1345   CALCIUM 8.9 06/24/2018 1345   PROT 8.0 06/24/2018 1345   ALBUMIN 3.8 06/24/2018 1345   AST 18 06/24/2018 1345   ALT 12 06/24/2018 1345   ALKPHOS 92 06/24/2018 1345   BILITOT 0.5 06/24/2018 1345   GFRNONAA 57 (L) 06/24/2018 1345   GFRAA >60 06/24/2018 1345    No results found for: SPEP, UPEP  Lab Results  Component Value Date   WBC 8.4 06/24/2018   NEUTROABS 5.2 06/24/2018   HGB 11.6 (L) 06/24/2018   HCT 39.0 06/24/2018   MCV 90.1 06/24/2018   PLT 687 (H) 06/24/2018      Chemistry      Component Value Date/Time   NA 139 06/24/2018 1345   K 3.3 (L) 06/24/2018 1345   CL 102 06/24/2018 1345   CO2 29 06/24/2018 1345   BUN 16 06/24/2018 1345   CREATININE 0.87 06/24/2018 1345      Component Value Date/Time   CALCIUM 8.9 06/24/2018 1345   ALKPHOS 92 06/24/2018 1345   AST 18 06/24/2018 1345   ALT 12 06/24/2018 1345   BILITOT 0.5 06/24/2018 1345      ASSESSMENT & PLAN:  Polycythemia vera ([place]) # Polycythemia vera-JAK-2 positive. Currently Asymptomatic;continue hydrea/asprin-? Worsening.  Question progression.  Especially given mild anemia.  LDH is normal.  #Today Hb 11.6; platelets- 687.  Increase hydrea to once day M-F; Sat-sun- 2 pills/day.  Monitor for now if worsening would recommend  ultrasound of the spleen.  Consider Jak 2 inhibitor.  # Coughing x 3 months- reluctant chest x-ray.  Recommend Prilosec once a day.  DISPOSITION:  #  No phlebotomy today.  # CBC/CMP/LDH/ in 4 months/-Dr.B     Cammie Sickle, MD 06/24/2018 4:55 PM

## 2018-10-21 ENCOUNTER — Ambulatory Visit: Payer: Medicare Other | Admitting: Internal Medicine

## 2018-10-21 ENCOUNTER — Other Ambulatory Visit: Payer: Medicare Other

## 2018-10-28 ENCOUNTER — Other Ambulatory Visit: Payer: Self-pay

## 2018-10-28 NOTE — Addendum Note (Signed)
Addended by: Renita Papa R on: 10/28/2018 12:01 PM   Modules accepted: Orders

## 2018-10-29 ENCOUNTER — Inpatient Hospital Stay: Payer: Medicare Other

## 2018-10-29 ENCOUNTER — Inpatient Hospital Stay (HOSPITAL_BASED_OUTPATIENT_CLINIC_OR_DEPARTMENT_OTHER): Payer: Medicare Other | Admitting: Internal Medicine

## 2018-10-29 ENCOUNTER — Inpatient Hospital Stay: Payer: Medicare Other | Attending: Internal Medicine

## 2018-10-29 ENCOUNTER — Other Ambulatory Visit: Payer: Self-pay

## 2018-10-29 DIAGNOSIS — R030 Elevated blood-pressure reading, without diagnosis of hypertension: Secondary | ICD-10-CM | POA: Insufficient documentation

## 2018-10-29 DIAGNOSIS — D45 Polycythemia vera: Secondary | ICD-10-CM | POA: Insufficient documentation

## 2018-10-29 DIAGNOSIS — Z79899 Other long term (current) drug therapy: Secondary | ICD-10-CM | POA: Insufficient documentation

## 2018-10-29 DIAGNOSIS — Z7982 Long term (current) use of aspirin: Secondary | ICD-10-CM | POA: Diagnosis not present

## 2018-10-29 LAB — COMPREHENSIVE METABOLIC PANEL
ALT: 11 U/L (ref 0–44)
AST: 19 U/L (ref 15–41)
Albumin: 4.2 g/dL (ref 3.5–5.0)
Alkaline Phosphatase: 93 U/L (ref 38–126)
Anion gap: 11 (ref 5–15)
BUN: 22 mg/dL (ref 8–23)
CO2: 27 mmol/L (ref 22–32)
Calcium: 9.2 mg/dL (ref 8.9–10.3)
Chloride: 102 mmol/L (ref 98–111)
Creatinine, Ser: 1.04 mg/dL — ABNORMAL HIGH (ref 0.44–1.00)
GFR calc Af Amer: 53 mL/min — ABNORMAL LOW (ref >60)
GFR calc non Af Amer: 46 mL/min — ABNORMAL LOW (ref >60)
Glucose, Bld: 93 mg/dL (ref 70–99)
Potassium: 3.7 mmol/L (ref 3.5–5.1)
Sodium: 140 mmol/L (ref 135–145)
Total Bilirubin: 0.6 mg/dL (ref 0.3–1.2)
Total Protein: 8.4 g/dL — ABNORMAL HIGH (ref 6.5–8.1)

## 2018-10-29 LAB — CBC WITH DIFFERENTIAL/PLATELET
Abs Immature Granulocytes: 0.03 10*3/uL (ref 0.00–0.07)
Basophils Absolute: 0.2 10*3/uL — ABNORMAL HIGH (ref 0.0–0.1)
Basophils Relative: 2 %
Eosinophils Absolute: 0.1 10*3/uL (ref 0.0–0.5)
Eosinophils Relative: 1 %
HCT: 42.9 % (ref 36.0–46.0)
Hemoglobin: 12.9 g/dL (ref 12.0–15.0)
Immature Granulocytes: 0 %
Lymphocytes Relative: 26 %
Lymphs Abs: 2.4 10*3/uL (ref 0.7–4.0)
MCH: 27.7 pg (ref 26.0–34.0)
MCHC: 30.1 g/dL (ref 30.0–36.0)
MCV: 92.1 fL (ref 80.0–100.0)
Monocytes Absolute: 0.9 10*3/uL (ref 0.1–1.0)
Monocytes Relative: 10 %
Neutro Abs: 5.5 10*3/uL (ref 1.7–7.7)
Neutrophils Relative %: 61 %
Platelets: 235 10*3/uL (ref 150–400)
RBC: 4.66 MIL/uL (ref 3.87–5.11)
RDW: 16.9 % — ABNORMAL HIGH (ref 11.5–15.5)
WBC: 9.2 10*3/uL (ref 4.0–10.5)
nRBC: 0 % (ref 0.0–0.2)

## 2018-10-29 LAB — LACTATE DEHYDROGENASE: LDH: 122 U/L (ref 98–192)

## 2018-10-29 NOTE — Progress Notes (Signed)
[place] OFFICE PROGRESS NOTE  Patient Care Team: Einar Pheasant, MD as PCP - General (Internal Medicine)  HPI  SUMMARY of HEMATOLOGIC HISTORY:  Oncology History Overview Note  # POLYCYTHEMIA VERA s/p BMBx- MAY 2010- hypercellular; no blasts; LOW EPO;JAK-2 POSITIVE on Hydrea 7 pill/week; Asa/d   # Hard of hearing/ [facility]  DIAGNOSIS: POLYCYTHEMIA VERA        ;GOALS: control  CURRENT/MOST RECENT THERAPY ; hydrea    Polycythemia vera ([place])     INTERVAL HISTORY:  A very pleasant 85 year-old female patient with above history of polycythemia vera on Hydrea/periodic phlebotomies is here for follow-up.   Patient denies any nausea vomiting.  Denies any weight loss.  Denies any new strokes or thromboembolic events.  No skin rash.   Review of Systems  Constitutional: Negative for chills, diaphoresis, fever, malaise/fatigue and weight loss.  HENT: Negative for nosebleeds and sore throat.        Chronic difficulty hearing.  Eyes: Negative for double vision.  Respiratory: Negative for hemoptysis, sputum production, shortness of breath and wheezing.   Cardiovascular: Negative for chest pain, palpitations, orthopnea and leg swelling.  Gastrointestinal: Negative for abdominal pain, blood in stool, constipation, diarrhea, melena, nausea and vomiting.  Genitourinary: Negative for dysuria, frequency and urgency.  Musculoskeletal:       Chronic joint pains.  Skin: Negative.  Negative for itching and rash.  Neurological: Negative for dizziness, tingling, focal weakness, weakness and headaches.  Endo/Heme/Allergies: Does not bruise/bleed easily.  Psychiatric/Behavioral: Negative for depression. The patient is not nervous/anxious and does not have insomnia.      I have reviewed the past medical history, past surgical history, social history and family history with the patient and they are unchanged from previous note unless stated above.  ALLERGIES:  is  allergic to penicillins and sulfa antibiotics.  MEDICATIONS:  Current Outpatient Medications  Medication Sig Dispense Refill  . acetaminophen (TYLENOL) 500 MG tablet Take 500 mg by mouth as needed for pain.    Marland Kitchen aspirin 81 MG tablet Take 81 mg by mouth daily.    Marland Kitchen docusate sodium (COLACE) 100 MG capsule Take 100 mg by mouth daily.    . folic acid (FOLVITE) 1 MG tablet Take 1 mg by mouth daily.    . hydrochlorothiazide (HYDRODIURIL) 25 MG tablet TAKE 1 TABLET DAILY 90 tablet 3  . hydroxyurea (HYDREA) 500 MG capsule TAKE ONE CAPSULE BY MOUTH DAILY EXCEPT ON TUESDAY, TAKE TWO CAPSULES (Patient taking differently: Take 500 mg by mouth daily. ) 35 capsule 6  . Hypromellose (ARTIFICIAL TEARS) 0.4 % SOLN Apply 1 drop to eye daily at 12 noon.    . Multiple Vitamins-Minerals (MULTIVITAL PO) Take 1 tablet by mouth daily.     No current facility-administered medications for this visit.     PHYSICAL EXAMINATION:   BP (!) 147/99 (BP Location: Right Arm, Patient Position: Sitting, Cuff Size: Normal)   Pulse 100   Temp 98.9 F (37.2 C) (Tympanic)   Resp 20   Ht 5\' 5"  (1.651 m)   Wt 159 lb (72.1 kg)   LMP 06/10/1958   BMI 26.46 kg/m   Filed Weights   10/29/18 1326  Weight: 159 lb (72.1 kg)    Physical Exam  Constitutional: She is oriented to person, place, and time and well-developed, well-nourished, and in no distress.  Patient is alone.  She is in a wheelchair.    HENT:  Head: Normocephalic and atraumatic.  Mouth/Throat: Oropharynx  is clear and moist. No oropharyngeal exudate.  Eyes: Pupils are equal, round, and reactive to light.  Neck: Normal range of motion. Neck supple.  Cardiovascular: Normal rate and regular rhythm.  Pulmonary/Chest: No respiratory distress. She has no wheezes.  Abdominal: Soft. Bowel sounds are normal. She exhibits no distension and no mass. There is no abdominal tenderness. There is no rebound and no guarding.  Musculoskeletal: Normal range of motion.         General: No tenderness or edema.  Neurological: She is alert and oriented to person, place, and time.  Skin: Skin is warm.  Psychiatric: Affect normal.    LABORATORY DATA:  I have reviewed the data as listed    Component Value Date/Time   NA 140 10/29/2018 1259   K 3.7 10/29/2018 1259   CL 102 10/29/2018 1259   CO2 27 10/29/2018 1259   GLUCOSE 93 10/29/2018 1259   BUN 22 10/29/2018 1259   CREATININE 1.04 (H) 10/29/2018 1259   CALCIUM 9.2 10/29/2018 1259   PROT 8.4 (H) 10/29/2018 1259   ALBUMIN 4.2 10/29/2018 1259   AST 19 10/29/2018 1259   ALT 11 10/29/2018 1259   ALKPHOS 93 10/29/2018 1259   BILITOT 0.6 10/29/2018 1259   GFRNONAA 46 (L) 10/29/2018 1259   GFRAA 53 (L) 10/29/2018 1259    No results found for: SPEP, UPEP  Lab Results  Component Value Date   WBC 9.2 10/29/2018   NEUTROABS 5.5 10/29/2018   HGB 12.9 10/29/2018   HCT 42.9 10/29/2018   MCV 92.1 10/29/2018   PLT 235 10/29/2018      Chemistry      Component Value Date/Time   NA 140 10/29/2018 1259   K 3.7 10/29/2018 1259   CL 102 10/29/2018 1259   CO2 27 10/29/2018 1259   BUN 22 10/29/2018 1259   CREATININE 1.04 (H) 10/29/2018 1259      Component Value Date/Time   CALCIUM 9.2 10/29/2018 1259   ALKPHOS 93 10/29/2018 1259   AST 19 10/29/2018 1259   ALT 11 10/29/2018 1259   BILITOT 0.6 10/29/2018 1259      ASSESSMENT & PLAN:  Polycythemia vera ([place]) # Polycythemia vera-JAK-2 positive.  Currently asymptomatic.  Continue Hydrea 500 mg once a day Monday through Friday; Saturday Sunday 2 pills a day.  #Hemoglobin today is 12.3; normal white count and platelet count.  No concern for transformation.  #Elevated blood pressure-140s over 90s; overall stable.  Continue checking at the nursing home.  DISPOSITION:  #  No phlebotomy today.  # MD- CBC/CMP/LDH/ in 6 months/-Dr.B     Cammie Sickle, MD 10/29/2018 9:08 PM

## 2018-10-29 NOTE — Assessment & Plan Note (Addendum)
#   Polycythemia vera-JAK-2 positive.  Currently asymptomatic.  Continue Hydrea 500 mg once a day Monday through Friday; Saturday Sunday 2 pills a day.  #Hemoglobin today is 12.3; normal white count and platelet count.  No concern for transformation.  #Elevated blood pressure-140s over 90s; overall stable.  Continue checking at the nursing home.  DISPOSITION:  #  No phlebotomy today.  # MD- CBC/CMP/LDH/ in 6 months/-Dr.B

## 2019-05-06 ENCOUNTER — Observation Stay
Admission: EM | Admit: 2019-05-06 | Discharge: 2019-05-07 | Payer: Medicare Other | Attending: Internal Medicine | Admitting: Internal Medicine

## 2019-05-06 ENCOUNTER — Inpatient Hospital Stay: Payer: Medicare Other | Attending: Internal Medicine

## 2019-05-06 ENCOUNTER — Inpatient Hospital Stay (HOSPITAL_BASED_OUTPATIENT_CLINIC_OR_DEPARTMENT_OTHER): Payer: Medicare Other | Admitting: Internal Medicine

## 2019-05-06 ENCOUNTER — Other Ambulatory Visit: Payer: Self-pay

## 2019-05-06 ENCOUNTER — Emergency Department: Payer: Medicare Other

## 2019-05-06 VITALS — BP 126/90 | HR 139 | Temp 98.6°F | Wt 163.0 lb

## 2019-05-06 DIAGNOSIS — Z9849 Cataract extraction status, unspecified eye: Secondary | ICD-10-CM | POA: Diagnosis not present

## 2019-05-06 DIAGNOSIS — Z8249 Family history of ischemic heart disease and other diseases of the circulatory system: Secondary | ICD-10-CM | POA: Insufficient documentation

## 2019-05-06 DIAGNOSIS — E785 Hyperlipidemia, unspecified: Secondary | ICD-10-CM | POA: Insufficient documentation

## 2019-05-06 DIAGNOSIS — E876 Hypokalemia: Secondary | ICD-10-CM | POA: Insufficient documentation

## 2019-05-06 DIAGNOSIS — Z882 Allergy status to sulfonamides status: Secondary | ICD-10-CM | POA: Insufficient documentation

## 2019-05-06 DIAGNOSIS — M1990 Unspecified osteoarthritis, unspecified site: Secondary | ICD-10-CM | POA: Diagnosis not present

## 2019-05-06 DIAGNOSIS — Z20822 Contact with and (suspected) exposure to covid-19: Secondary | ICD-10-CM | POA: Diagnosis not present

## 2019-05-06 DIAGNOSIS — Z79899 Other long term (current) drug therapy: Secondary | ICD-10-CM | POA: Insufficient documentation

## 2019-05-06 DIAGNOSIS — Z90710 Acquired absence of both cervix and uterus: Secondary | ICD-10-CM | POA: Diagnosis not present

## 2019-05-06 DIAGNOSIS — D45 Polycythemia vera: Secondary | ICD-10-CM

## 2019-05-06 DIAGNOSIS — R000 Tachycardia, unspecified: Secondary | ICD-10-CM | POA: Diagnosis not present

## 2019-05-06 DIAGNOSIS — I471 Supraventricular tachycardia: Secondary | ICD-10-CM | POA: Diagnosis not present

## 2019-05-06 DIAGNOSIS — I4891 Unspecified atrial fibrillation: Principal | ICD-10-CM | POA: Diagnosis present

## 2019-05-06 DIAGNOSIS — Z880 Allergy status to penicillin: Secondary | ICD-10-CM | POA: Insufficient documentation

## 2019-05-06 DIAGNOSIS — Z9049 Acquired absence of other specified parts of digestive tract: Secondary | ICD-10-CM | POA: Insufficient documentation

## 2019-05-06 DIAGNOSIS — I10 Essential (primary) hypertension: Secondary | ICD-10-CM | POA: Diagnosis present

## 2019-05-06 DIAGNOSIS — I252 Old myocardial infarction: Secondary | ICD-10-CM | POA: Insufficient documentation

## 2019-05-06 DIAGNOSIS — E7800 Pure hypercholesterolemia, unspecified: Secondary | ICD-10-CM | POA: Insufficient documentation

## 2019-05-06 DIAGNOSIS — Z7982 Long term (current) use of aspirin: Secondary | ICD-10-CM | POA: Insufficient documentation

## 2019-05-06 DIAGNOSIS — I2510 Atherosclerotic heart disease of native coronary artery without angina pectoris: Secondary | ICD-10-CM | POA: Diagnosis not present

## 2019-05-06 DIAGNOSIS — R748 Abnormal levels of other serum enzymes: Secondary | ICD-10-CM | POA: Diagnosis not present

## 2019-05-06 LAB — TROPONIN I (HIGH SENSITIVITY)
Troponin I (High Sensitivity): 13 ng/L (ref <18)
Troponin I (High Sensitivity): 13 ng/L (ref <18)

## 2019-05-06 LAB — CBC WITH DIFFERENTIAL/PLATELET
Abs Immature Granulocytes: 0.03 10*3/uL (ref 0.00–0.07)
Basophils Absolute: 0.2 10*3/uL — ABNORMAL HIGH (ref 0.0–0.1)
Basophils Relative: 3 %
Eosinophils Absolute: 0.2 10*3/uL (ref 0.0–0.5)
Eosinophils Relative: 2 %
HCT: 44 % (ref 36.0–46.0)
Hemoglobin: 13 g/dL (ref 12.0–15.0)
Immature Granulocytes: 0 %
Lymphocytes Relative: 22 %
Lymphs Abs: 2 10*3/uL (ref 0.7–4.0)
MCH: 27.7 pg (ref 26.0–34.0)
MCHC: 29.5 g/dL — ABNORMAL LOW (ref 30.0–36.0)
MCV: 93.6 fL (ref 80.0–100.0)
Monocytes Absolute: 0.8 10*3/uL (ref 0.1–1.0)
Monocytes Relative: 8 %
Neutro Abs: 6.1 10*3/uL (ref 1.7–7.7)
Neutrophils Relative %: 65 %
Platelets: 429 10*3/uL — ABNORMAL HIGH (ref 150–400)
RBC: 4.7 MIL/uL (ref 3.87–5.11)
RDW: 15.9 % — ABNORMAL HIGH (ref 11.5–15.5)
WBC: 9.4 10*3/uL (ref 4.0–10.5)
nRBC: 0 % (ref 0.0–0.2)

## 2019-05-06 LAB — COMPREHENSIVE METABOLIC PANEL
ALT: 11 U/L (ref 0–44)
AST: 18 U/L (ref 15–41)
Albumin: 4 g/dL (ref 3.5–5.0)
Alkaline Phosphatase: 92 U/L (ref 38–126)
Anion gap: 9 (ref 5–15)
BUN: 18 mg/dL (ref 8–23)
CO2: 28 mmol/L (ref 22–32)
Calcium: 9.3 mg/dL (ref 8.9–10.3)
Chloride: 102 mmol/L (ref 98–111)
Creatinine, Ser: 0.88 mg/dL (ref 0.44–1.00)
GFR calc Af Amer: >60 mL/min (ref >60)
GFR calc non Af Amer: 55 mL/min — ABNORMAL LOW (ref >60)
Glucose, Bld: 141 mg/dL — ABNORMAL HIGH (ref 70–99)
Potassium: 3.3 mmol/L — ABNORMAL LOW (ref 3.5–5.1)
Sodium: 139 mmol/L (ref 135–145)
Total Bilirubin: 0.4 mg/dL (ref 0.3–1.2)
Total Protein: 8.2 g/dL — ABNORMAL HIGH (ref 6.5–8.1)

## 2019-05-06 LAB — MAGNESIUM: Magnesium: 2 mg/dL (ref 1.7–2.4)

## 2019-05-06 LAB — BRAIN NATRIURETIC PEPTIDE: B Natriuretic Peptide: 243 pg/mL — ABNORMAL HIGH (ref 0.0–100.0)

## 2019-05-06 LAB — RESPIRATORY PANEL BY RT PCR (FLU A&B, COVID)
Influenza A by PCR: NEGATIVE
Influenza B by PCR: NEGATIVE
SARS Coronavirus 2 by RT PCR: NEGATIVE

## 2019-05-06 LAB — TSH: TSH: 4.765 u[IU]/mL — ABNORMAL HIGH (ref 0.350–4.500)

## 2019-05-06 LAB — T4, FREE: Free T4: 0.72 ng/dL (ref 0.61–1.12)

## 2019-05-06 LAB — LACTATE DEHYDROGENASE: LDH: 128 U/L (ref 98–192)

## 2019-05-06 MED ORDER — DILTIAZEM HCL-DEXTROSE 125-5 MG/125ML-% IV SOLN (PREMIX)
5.0000 mg/h | INTRAVENOUS | Status: DC
  Administered 2019-05-06: 19:00:00 5 mg/h via INTRAVENOUS
  Administered 2019-05-07: 06:00:00 12.5 mg/h via INTRAVENOUS
  Filled 2019-05-06 (×2): qty 125

## 2019-05-06 MED ORDER — HYDROCHLOROTHIAZIDE 25 MG PO TABS
25.0000 mg | ORAL_TABLET | Freq: Every day | ORAL | Status: DC
  Administered 2019-05-07: 09:00:00 25 mg via ORAL
  Filled 2019-05-06: qty 1

## 2019-05-06 MED ORDER — ENOXAPARIN SODIUM 40 MG/0.4ML ~~LOC~~ SOLN: 40.0000 mg | SUBCUTANEOUS | Status: DC

## 2019-05-06 MED ORDER — HYDROXYUREA 500 MG PO CAPS
500.0000 mg | ORAL_CAPSULE | Freq: Every day | ORAL | Status: DC
  Administered 2019-05-07: 09:00:00 500 mg via ORAL
  Filled 2019-05-06: qty 1

## 2019-05-06 MED ORDER — DILTIAZEM HCL 60 MG PO TABS
60.0000 mg | ORAL_TABLET | Freq: Once | ORAL | Status: AC
  Administered 2019-05-06: 17:00:00 60 mg via ORAL
  Filled 2019-05-06: qty 1

## 2019-05-06 MED ORDER — ACETAMINOPHEN 325 MG PO TABS: 650.0000 mg | ORAL_TABLET | ORAL | Status: DC | PRN

## 2019-05-06 MED ORDER — DILTIAZEM HCL 25 MG/5ML IV SOLN
10.0000 mg | Freq: Once | INTRAVENOUS | Status: AC
  Administered 2019-05-06: 16:00:00 10 mg via INTRAVENOUS
  Filled 2019-05-06: qty 5

## 2019-05-06 MED ORDER — FOLIC ACID 1 MG PO TABS
1.0000 mg | ORAL_TABLET | Freq: Every day | ORAL | Status: DC
  Administered 2019-05-07: 09:00:00 1 mg via ORAL
  Filled 2019-05-06: qty 1

## 2019-05-06 MED ORDER — ASPIRIN 81 MG PO CHEW
81.0000 mg | CHEWABLE_TABLET | Freq: Every day | ORAL | Status: DC
  Administered 2019-05-07: 09:00:00 81 mg via ORAL
  Filled 2019-05-06: qty 1

## 2019-05-06 MED ORDER — POTASSIUM CHLORIDE CRYS ER 20 MEQ PO TBCR
40.0000 meq | EXTENDED_RELEASE_TABLET | Freq: Once | ORAL | Status: AC
  Administered 2019-05-06: 16:00:00 40 meq via ORAL
  Filled 2019-05-06: qty 2

## 2019-05-06 MED ORDER — DILTIAZEM HCL 25 MG/5ML IV SOLN
20.0000 mg | Freq: Once | INTRAVENOUS | Status: AC
  Administered 2019-05-06: 16:00:00 20 mg via INTRAVENOUS
  Filled 2019-05-06: qty 5

## 2019-05-06 MED ORDER — ONDANSETRON HCL 4 MG/2ML IJ SOLN: 4.0000 mg | Freq: Four times a day (QID) | INTRAMUSCULAR | Status: DC | PRN

## 2019-05-06 NOTE — Progress Notes (Signed)
[place] OFFICE PROGRESS NOTE  Patient Care Team: Einar Pheasant, MD as PCP - General (Internal Medicine)  HPI  SUMMARY of HEMATOLOGIC HISTORY:  Oncology History Overview Note  # POLYCYTHEMIA VERA s/p BMBx- MAY 2010- hypercellular; no blasts; LOW EPO;JAK-2 POSITIVE on Hydrea 7 pill/week; Asa/d   # Hard of hearing/ [facility]  DIAGNOSIS: POLYCYTHEMIA VERA        ;GOALS: control  CURRENT/MOST RECENT THERAPY ; hydrea    Polycythemia vera ([place])     INTERVAL HISTORY:  A very pleasant 86 year-old female patient with above history of polycythemia vera on Hydrea/periodic phlebotomies is here for follow-up.   No hospitalizations.  Patient denies any palpitations but denies any chest pain.  Denies any shortness of breath.  No new strokes or thromboembolic events.  No skin rash.  No weight loss.    Review of Systems  Constitutional: Negative for chills, diaphoresis, fever, malaise/fatigue and weight loss.  HENT: Negative for nosebleeds and sore throat.        Chronic difficulty hearing.  Eyes: Negative for double vision.  Respiratory: Negative for hemoptysis, sputum production, shortness of breath and wheezing.   Cardiovascular: Negative for chest pain, palpitations, orthopnea and leg swelling.  Gastrointestinal: Negative for abdominal pain, blood in stool, constipation, diarrhea, melena, nausea and vomiting.  Genitourinary: Negative for dysuria, frequency and urgency.  Musculoskeletal:       Chronic joint pains.  Skin: Negative.  Negative for itching and rash.  Neurological: Negative for dizziness, tingling, focal weakness, weakness and headaches.  Endo/Heme/Allergies: Does not bruise/bleed easily.  Psychiatric/Behavioral: Negative for depression. The patient is not nervous/anxious and does not have insomnia.      I have reviewed the past medical history, past surgical history, social history and family history with the patient and they are  unchanged from previous note unless stated above.  ALLERGIES:  is allergic to penicillins and sulfa antibiotics.  MEDICATIONS:  Current Outpatient Medications  Medication Sig Dispense Refill  . acetaminophen (TYLENOL) 500 MG tablet Take 500 mg by mouth as needed for pain.    Marland Kitchen aspirin 81 MG tablet Take 81 mg by mouth daily.    Marland Kitchen docusate sodium (COLACE) 100 MG capsule Take 100 mg by mouth daily.    . folic acid (FOLVITE) 1 MG tablet Take 1 mg by mouth daily.    . hydrochlorothiazide (HYDRODIURIL) 25 MG tablet TAKE 1 TABLET DAILY 90 tablet 3  . hydroxyurea (HYDREA) 500 MG capsule TAKE ONE CAPSULE BY MOUTH DAILY EXCEPT ON TUESDAY, TAKE TWO CAPSULES (Patient taking differently: Take 500 mg by mouth daily. ) 35 capsule 6  . Hypromellose (ARTIFICIAL TEARS) 0.4 % SOLN Apply 1 drop to eye daily at 12 noon.    . Multiple Vitamins-Minerals (MULTIVITAL PO) Take 1 tablet by mouth daily.     No current facility-administered medications for this visit.    PHYSICAL EXAMINATION:   BP 126/90 (BP Location: Left Arm, Patient Position: Sitting, Cuff Size: Normal)   Pulse (!) 139   Temp 98.6 F (37 C) (Tympanic)   Wt 163 lb (73.9 kg)   LMP 06/10/1958   BMI 27.12 kg/m   Filed Weights   05/06/19 1353  Weight: 163 lb (73.9 kg)    Physical Exam  Constitutional: She is oriented to person, place, and time and well-developed, well-nourished, and in no distress.  Patient sitting in a wheelchair.  She is accompanied by caregiver.  HENT:  Head: Normocephalic and atraumatic.  Mouth/Throat: Oropharynx  is clear and moist. No oropharyngeal exudate.  Eyes: Pupils are equal, round, and reactive to light.  Cardiovascular: Normal rate and regular rhythm.  Irregularly irregular heart rhythm.  Tachycardic.  Pulmonary/Chest: Effort normal and breath sounds normal. No respiratory distress. She has no wheezes.  Abdominal: Soft. Bowel sounds are normal. She exhibits no distension and no mass. There is no abdominal  tenderness. There is no rebound and no guarding.  Musculoskeletal:        General: No tenderness or edema. Normal range of motion.     Cervical back: Normal range of motion and neck supple.  Neurological: She is alert and oriented to person, place, and time.  Skin: Skin is warm.  Psychiatric: Affect normal.    LABORATORY DATA:  I have reviewed the data as listed    Component Value Date/Time   NA 139 05/06/2019 1323   K 3.3 (L) 05/06/2019 1323   CL 102 05/06/2019 1323   CO2 28 05/06/2019 1323   GLUCOSE 141 (H) 05/06/2019 1323   BUN 18 05/06/2019 1323   CREATININE 0.88 05/06/2019 1323   CALCIUM 9.3 05/06/2019 1323   PROT 8.2 (H) 05/06/2019 1323   ALBUMIN 4.0 05/06/2019 1323   AST 18 05/06/2019 1323   ALT 11 05/06/2019 1323   ALKPHOS 92 05/06/2019 1323   BILITOT 0.4 05/06/2019 1323   GFRNONAA 55 (L) 05/06/2019 1323   GFRAA >60 05/06/2019 1323    No results found for: SPEP, UPEP  Lab Results  Component Value Date   WBC 9.4 05/06/2019   NEUTROABS 6.1 05/06/2019   HGB 13.0 05/06/2019   HCT 44.0 05/06/2019   MCV 93.6 05/06/2019   PLT 429 (H) 05/06/2019      Chemistry      Component Value Date/Time   NA 139 05/06/2019 1323   K 3.3 (L) 05/06/2019 1323   CL 102 05/06/2019 1323   CO2 28 05/06/2019 1323   BUN 18 05/06/2019 1323   CREATININE 0.88 05/06/2019 1323      Component Value Date/Time   CALCIUM 9.3 05/06/2019 1323   ALKPHOS 92 05/06/2019 1323   AST 18 05/06/2019 1323   ALT 11 05/06/2019 1323   BILITOT 0.4 05/06/2019 1323      ASSESSMENT & PLAN:  Polycythemia vera ([place]) # Polycythemia vera-JAK-2 positive.  Currently asymptomatic.  Continue Hydrea 500 mg once a day.   #Hemoglobin today is 13; HCT 44; normal white count and platelet count- slightly elevated.  No concern for transformation.  #Tachycardia-A. fib with RVR-140s-asymptomatic.  However patient high risk for complications if not controlled soon.  I discussed with patient's nurse/[facility] Jana Half  [place]-recommend go to the emergency room for further evaluation and treatment.  # # I discussed regarding Covid precautions/and also discussed proceeding with Covid vaccination when available.  Discussed that unfortunately the data safety and efficacy of vaccination is unclear especially in patients with immunocompromised state.  However, I think the benefits of the vaccination outweigh the potential risks.patient in fact is awaiting Covid vaccination tomorrow.  But see above.  DISPOSITION:  #  No phlebotomy today.  # MD- CBC/CMP/LDH/ in 6 months/possible Phlebotomy-Dr.B     Cammie Sickle, MD 05/06/2019 2:58 PM

## 2019-05-06 NOTE — ED Triage Notes (Signed)
Pt brought up from the Cancer center in new onset a-fib with RVR. Pt denies any chest pain or SOB.pt is [facility]

## 2019-05-06 NOTE — H&P (Signed)
History and Physical    VENISE ELLINGWOOD ZOX:[id number] DOB: 15-Aug-1920 DOA: 05/06/2019  PCP: Einar Pheasant, MD  Patient coming from: Oncology office  I have personally briefly reviewed patient's old medical records in [place]  Chief Complaint: Atrial fibrillation  HPI: BRIAN KOCOUREK is a 86 y.o. female with medical history significant for polycythemia vera, CAD, hypertension, hyperlipidemia, osteoarthritis, and hard of hearing who presents to the ED for evaluation of apparent new onset atrial fibrillation with RVR.  Patient was seen at her oncologist office for routine follow-up of her polycythemia vera.  She was noted to have a rapid heart rate in the 140s and was advised to present to the ED for further evaluation and management.  Patient has no known prior history of atrial fibrillation or arrhythmia.  She currently denies any chest pain, palpitations, dyspnea, abdominal pain, dysuria, edema, or pain.  ED Course:  Initial vitals showed BP 183/93, pulse 128, RR 20, SPO2 93% on room air.  Labs notable for potassium 3.3, magnesium 2.0, sodium 139, bicarb 28, BUN 18, creatinine 0.88, serum glucose 141, WBC 9.4, hemoglobin 13.0, platelets 409,000, TSH 4.765, free T4 0.72, high-sensitivity troponin I 13x2, BNP 243.  Respiratory panel was negative for SARS-CoV-2 PCR, influenza A and B.  Portable chest x-ray was negative for focal consolidation, edema, or effusion.  Patient was given 40 mEq of oral potassium, diltiazem IV 10 mg, oral diltiazem 60 mg, IV diltiazem 20 mg, and placed on diltiazem infusion.  The hospitalist service was consulted today for further evaluation and management.  Review of Systems: All systems reviewed and are negative except as documented in history of present illness above.   Past Medical History:  Diagnosis Date  . Coronary artery disease    Status post heart cath with two insignificant vessel blockages, patient chose medical management  . History  of Clostridium difficile infection    After antibiotics of a dental procedure  . History of degenerative disc disease   . Hypertension   . Osteoarthritis   . Polycythemia vera(238.4)     GWK (Hydrea)  . Pure hypercholesterolemia     Past Surgical History:  Procedure Laterality Date  . ABDOMINAL HYSTERECTOMY  1970   ovaries left in place  . APPENDECTOMY     performed with hysterectomy  . BONE MARROW BIOPSY  08/2008  . CATARACT EXTRACTION  03/06/11   . CHOLECYSTECTOMY      Social History:  reports that she has never smoked. She has never used smokeless tobacco. She reports that she does not drink alcohol or use drugs.  Allergies  Allergen Reactions  . Penicillins   . Sulfa Antibiotics     Family History  Problem Relation Age of Onset  . Hypertension Mother   . Cancer Neg Hx        No colon or breast cancer     Prior to Admission medications   Medication Sig Start Date End Date Taking? Authorizing Provider  acetaminophen (TYLENOL) 500 MG tablet Take 500 mg by mouth as needed for pain.    [provider]  aspirin 81 MG tablet Take 81 mg by mouth daily.    [provider]  docusate sodium (COLACE) 100 MG capsule Take 100 mg by mouth daily.    [provider]  folic acid (FOLVITE) 1 MG tablet Take 1 mg by mouth daily.    [provider]  hydrochlorothiazide (HYDRODIURIL) 25 MG tablet TAKE 1 TABLET DAILY 09/05/15   Nicki Reaper,  Charlene, MD  hydroxyurea (HYDREA) 500 MG capsule TAKE ONE CAPSULE BY MOUTH DAILY EXCEPT ON TUESDAY, TAKE TWO CAPSULES Patient taking differently: Take 500 mg by mouth daily.  04/16/16   Heath Lark, MD  Hypromellose (ARTIFICIAL TEARS) 0.4 % SOLN Apply 1 drop to eye daily at 12 noon.    [provider]  Multiple Vitamins-Minerals (MULTIVITAL PO) Take 1 tablet by mouth daily.    [provider]    Physical Exam: Vitals:   05/06/19 1713 05/06/19 1900 05/06/19 1915 05/06/19 1930  BP:  107/76  125/79   Pulse: (!) 132   (!) 135  Resp: 19 18 (!) 22 20  TempSrc:      SpO2: 95%   95%  Weight:        Constitutional: Elderly woman resting supine in bed, NAD, calm, comfortable Eyes: PERRL, lids and conjunctivae normal ENMT: Mucous membranes are moist. Posterior pharynx clear of any exudate or lesions.hard of hearing. Neck: normal, supple, no masses. Respiratory: clear to auscultation bilaterally, no wheezing, no crackles. Normal respiratory effort. No accessory muscle use.  Cardiovascular: Tachycardic, no murmurs / rubs / gallops. No extremity edema. 2+ pedal pulses. Abdomen: no tenderness, no masses palpated. No hepatosplenomegaly. Bowel sounds positive.  Musculoskeletal: no clubbing / cyanosis. No joint deformity upper and lower extremities. Good ROM, no contractures. Normal muscle tone.  Skin: no rashes, lesions, ulcers. No induration Neurologic: CN 2-12 grossly intact. Sensation intact, Strength 5/5 in all 4 while resting in bed.  Psychiatric: Normal judgment and insight. Alert and oriented x 3. Normal mood.   Labs on Admission: I have personally reviewed following labs and imaging studies  CBC: Recent Labs  Lab 05/06/19 1323  WBC 9.4  NEUTROABS 6.1  HGB 13.0  HCT 44.0  MCV 93.6  PLT 937*   Basic Metabolic Panel: Recent Labs  Lab 05/06/19 1323 05/06/19 1519  NA 139  --   K 3.3*  --   CL 102  --   CO2 28  --   GLUCOSE 141*  --   BUN 18  --   CREATININE 0.88  --   CALCIUM 9.3  --   MG  --  2.0   GFR: CrCl cannot be calculated (Unknown ideal weight.). Liver Function Tests: Recent Labs  Lab 05/06/19 1323  AST 18  ALT 11  ALKPHOS 92  BILITOT 0.4  PROT 8.2*  ALBUMIN 4.0   No results for input(s): LIPASE, AMYLASE in the last 168 hours. No results for input(s): AMMONIA in the last 168 hours. Coagulation Profile: No results for input(s): INR, PROTIME in the last 168 hours. Cardiac Enzymes: No results for input(s): CKTOTAL, CKMB, CKMBINDEX, TROPONINI in the  last 168 hours. BNP (last 3 results) No results for input(s): PROBNP in the last 8760 hours. HbA1C: No results for input(s): HGBA1C in the last 72 hours. CBG: No results for input(s): GLUCAP in the last 168 hours. Lipid Profile: No results for input(s): CHOL, HDL, LDLCALC, TRIG, CHOLHDL, LDLDIRECT in the last 72 hours. Thyroid Function Tests: Recent Labs    05/06/19 1519  TSH 4.765*  FREET4 0.72   Anemia Panel: No results for input(s): VITAMINB12, FOLATE, FERRITIN, TIBC, IRON, RETICCTPCT in the last 72 hours. Urine analysis: No results found for: COLORURINE, APPEARANCEUR, LABSPEC, [place], GLUCOSEU, [place], BILIRUBINUR, KETONESUR, PROTEINUR, UROBILINOGEN, NITRITE, LEUKOCYTESUR  Radiological Exams on Admission: DG Chest Port 1 View  Result Date: 05/06/2019 CLINICAL DATA:  History of atrial fibrillation and hypertension EXAM: PORTABLE CHEST 1 VIEW COMPARISON:  01/21/2008 CT of the chest FINDINGS: Cardiac shadow is at the upper limits of normal in size. Aortic calcifications are seen. The lungs are well aerated bilaterally. Previously seen pulmonary nodules are not well visualized. Slight increased density is noted within the right hemithorax which may be related to patient rotation and overlying soft tissue. No focal confluent infiltrate is seen. IMPRESSION: Slight increased density in the right chest likely related to patient rotation and overlying soft tissue density. No focal infiltrate is seen. Electronically Signed   By: Inez Catalina M.D.   On: 05/06/2019 15:46    EKG: Independently reviewed. Atrial fibrillation with RVR,, rate 138, no prior for comparison.  Assessment/Plan Principal Problem:   Atrial fibrillation with RVR (HCC) Active Problems:   Essential hypertension, benign   Polycythemia vera (HCC)  LORRAYNE ISMAEL is a 86 y.o. female with medical history significant for polycythemia vera, CAD, hypertension, hyperlipidemia, osteoarthritis, and hard of hearing who is  admitted for with RVR.  Atrial fibrillation with RVR: New onset without prior known history.  CHA2DS2-VASc score is at least 4.  Per daughter she is essentially wheelchair-bound.  She currently takes aspirin 81 mg daily. -Continue diltiazem gtt, wean off and transition to orals as able -Monitor telemetry -Echocardiogram -Continue aspirin 81 mg daily, will hold off on full dose anticoagulation given age and fall risk -this was discussed with her daughter by phone  Hypokalemia: Supplemented, repeat labs in a.m.  CAD: Denies any chest pain.  High-sensitivity troponins negative x2.  Gram-negative.  Hypertension: Resume HCTZ tomorrow.  Continue monitor while on diltiazem infusion.  Polycythemia vera: Continue home hydroxyurea.  DVT prophylaxis: Lovenox Code Status: DNR, confirmed with daughter Family Communication: Discussed with daughter Early Osmond by phone [phone] Disposition Plan: Likely discharge to prior facility pending adequate rate control Consults called: None Admission status: Observation   Zada Finders MD [facility]  If 7PM-7AM, please contact night-coverage [email or web address]  05/06/2019, 7:44 PM

## 2019-05-06 NOTE — Assessment & Plan Note (Addendum)
#   Polycythemia vera-JAK-2 positive.  Currently asymptomatic.  Continue Hydrea 500 mg once a day.   #Hemoglobin today is 13; HCT 44; normal white count and platelet count- slightly elevated.  No concern for transformation.  #Tachycardia-A. fib with RVR-140s-asymptomatic.  However patient high risk for complications if not controlled soon.  I discussed with patient's nurse/[facility] Jana Half [place]-recommend go to the emergency room for further evaluation and treatment.  # # I discussed regarding Covid precautions/and also discussed proceeding with Covid vaccination when available.  Discussed that unfortunately the data safety and efficacy of vaccination is unclear especially in patients with immunocompromised state.  However, I think the benefits of the vaccination outweigh the potential risks.patient in fact is awaiting Covid vaccination tomorrow.  But see above.  DISPOSITION:  #  No phlebotomy today.  # MD- CBC/CMP/LDH/ in 6 months/possible Phlebotomy-Dr.B

## 2019-05-06 NOTE — ED Provider Notes (Signed)
[facility] Emergency Department Provider Note  ____________________________________________   First MD Initiated Contact with Patient 05/06/19 1510     (approximate)  I have reviewed the triage vital signs and the nursing notes.   HISTORY  Chief Complaint Atrial Fibrillation    HPI Tina Bond is a 86 y.o. female with polycythemia who comes in due to elevated heart rate patient.  Patient was in oncology getting follow-up for the above when they noticed her to be tachycardic with A. fib and the rates of 140.  The facility talk to her Allison who recommended to go to the ER for evaluation.  Patient is very hard of hearing but when I can tell she is never had a history of this before.  She denies any symptoms.  Unclear when the A. fib started, nothing made it better, nothing makes it worse, constant.  She denies any chest pain or shortness of breath.        Past Medical History:  Diagnosis Date  . Coronary artery disease    Status post heart cath with two insignificant vessel blockages, patient chose medical management  . History of Clostridium difficile infection    After antibiotics of a dental procedure  . History of degenerative disc disease   . Hypertension   . Osteoarthritis   . Polycythemia vera(238.4)     GWK (Hyd[place])  . Pure hypercholesterolemia     Patient Active Problem List   Diagnosis Date Noted  . Fall 06/03/2016  . Healthcare maintenance 12/31/2015  . Elevated alkaline phosphatase level 12/21/2013  . Environmental allergies 12/16/2012  . Arthritis 12/16/2012  . Polycythemia vera ([place]) 06/12/2012  . Depression 06/12/2012  . Diverticulosis 06/12/2012  . Hypercholesterolemia 06/10/2012  . Essential hypertension, benign 06/10/2012    Past Surgical History:  Procedure Laterality Date  . ABDOMINAL HYSTERECTOMY  1970   ovaries left in place  . APPENDECTOMY     performed with hysterectomy  . BONE MARROW BIOPSY   08/2008  . CATARACT EXTRACTION  03/06/11   . CHOLECYSTECTOMY      Prior to Admission medications   Medication Sig Start Date End Date Taking? Authorizing Provider  acetaminophen (TYLENOL) 500 MG tablet Take 500 mg by mouth as needed for pain.    [provider]  aspirin 81 MG tablet Take 81 mg by mouth daily.    [provider]  docusate sodium (COLACE) 100 MG capsule Take 100 mg by mouth daily.    [provider]  folic acid (FOLVITE) 1 MG tablet Take 1 mg by mouth daily.    [provider]  hydrochlorothiazide (HYDRODIURIL) 25 MG tablet TAKE 1 TABLET DAILY 09/05/15   Einar Pheasant, MD  hydroxyurea (HYDREA) 500 MG capsule TAKE ONE CAPSULE BY MOUTH DAILY EXCEPT ON TUESDAY, TAKE TWO CAPSULES Patient taking differently: Take 500 mg by mouth daily.  04/16/16   Heath Lark, MD  Hypromellose (ARTIFICIAL TEARS) 0.4 % SOLN Apply 1 drop to eye daily at 12 noon.    [provider]  Multiple Vitamins-Minerals (MULTIVITAL PO) Take 1 tablet by mouth daily.    [provider]    Allergies Penicillins and Sulfa antibiotics  Family History  Problem Relation Age of Onset  . Hypertension Mother   . Cancer Neg Hx        No colon or breast cancer    Social History Social History   Tobacco Use  . Smoking status: Never Smoker  . Smokeless tobacco:  Never Used  Substance Use Topics  . Alcohol use: No    Alcohol/week: 0.0 standard drinks  . Drug use: No      Review of Systems Constitutional: No fever/chills Eyes: No visual changes. ENT: No sore throat. Cardiovascular: Denies chest pain.  Irregular rhythm Respiratory: Denies shortness of breath. Gastrointestinal: No abdominal pain.  No nausea, no vomiting.  No diarrhea.  No constipation. Genitourinary: Negative for dysuria. Musculoskeletal: Negative for back pain. Skin: Negative for rash. Neurological: Negative for headaches, focal weakness or numbness. All other ROS  negative ____________________________________________   PHYSICAL EXAM:  VITAL SIGNS: Vitals:   05/06/19 1522  BP: (!) 183/93  Pulse: (!) 140  Resp: 20  SpO2: 93%    Constitutional: Alert and oriented. Well appearing and in no acute distress.  Very hard of hearing. Eyes: Conjunctivae are normal. EOMI. Head: Atraumatic. Nose: No congestion/rhinnorhea. Mouth/Throat: Mucous membranes are moist.   Neck: No stridor. Trachea Midline. FROM Cardiovascular: Irregular tachycardic rhythm. Grossly normal heart sounds.  Good peripheral circulation. Respiratory: Normal respiratory effort.  No retractions. Lungs CTAB. Gastrointestinal: Soft and nontender. No distention. No abdominal bruits.  Musculoskeletal: 1+ edema bilaterally no joint effusions. Neurologic:  Normal speech and language. No gross focal neurologic deficits are appreciated.  Skin:  Skin is warm, dry and intact. No rash noted. Psychiatric: Mood and affect are normal. Speech and behavior are normal. GU: Deferred   ____________________________________________   LABS (all labs ordered are listed, but only abnormal results are displayed)  Labs Reviewed  RESPIRATORY PANEL BY RT PCR (FLU A&B, COVID)  MAGNESIUM  TSH  T4, FREE  BRAIN NATRIURETIC PEPTIDE  TROPONIN I (HIGH SENSITIVITY)   ____________________________________________   ED ECG REPORT I, Vanessa [place], the attending physician, personally viewed and interpreted this ECG.  EKG done at the oncology unit shows A. fib rate of 140, no ST elevation, no T wave inversions, some ST depressions in V5 and V6, 1 and 2, normal intervals ____________________________________________  RADIOLOGY Robert Bellow, personally viewed and evaluated these images (plain radiographs) as part of my medical decision making, as well as reviewing the written report by the radiologist.  ED MD interpretation:  Slight increased density on right chest wall. No focal consolidations.   Official  radiology report(s): DG Chest Port 1 View  Result Date: 05/06/2019 CLINICAL DATA:  History of atrial fibrillation and hypertension EXAM: PORTABLE CHEST 1 VIEW COMPARISON:  01/21/2008 CT of the chest FINDINGS: Cardiac shadow is at the upper limits of normal in size. Aortic calcifications are seen. The lungs are well aerated bilaterally. Previously seen pulmonary nodules are not well visualized. Slight increased density is noted within the right hemithorax which may be related to patient rotation and overlying soft tissue. No focal confluent infiltrate is seen. IMPRESSION: Slight increased density in the right chest likely related to patient rotation and overlying soft tissue density. No focal infiltrate is seen. Electronically Signed   By: Inez Catalina M.D.   On: 05/06/2019 15:46    ____________________________________________   PROCEDURES  Procedure(s) performed (including Critical Care):  .Critical Care Performed by: Vanessa [place], MD Authorized by: Vanessa Echo, MD   Critical care provider statement:    Critical care time (minutes):  45   Critical care was necessary to treat or prevent imminent or life-threatening deterioration of the following conditions:  Circulatory failure   Critical care was time spent personally by me on the following activities:  Discussions with consultants, evaluation of patient's response  to treatment, examination of patient, ordering and performing treatments and interventions, ordering and review of laboratory studies, ordering and review of radiographic studies, pulse oximetry, re-evaluation of patient's condition, obtaining history from patient or surrogate and review of old charts     ____________________________________________   INITIAL IMPRESSION / [place] / ED COURSE  CEAIRRA MCCARVER was evaluated in Emergency Department on 05/06/2019 for the symptoms described in the history of present illness. She was evaluated in the context of the  global COVID-19 pandemic, which necessitated consideration that the patient might be at risk for infection with the SARS-CoV-2 virus that causes COVID-19. Institutional protocols and algorithms that pertain to the evaluation of patients at risk for COVID-19 are in a state of rapid change based on information released by regulatory bodies including the CDC and federal and state organizations. These policies and algorithms were followed during the patient's care in the ED.    Patient presents with new onset A. fib.  Unclear how long she has been in it but rates are in the 140s.  Patient does have a little bit of edema in her legs unclear if this is new or not.  Will get labs to evaluate for anemia, electrolyte abnormalities, heart failure, ACS.  Will get chest x-ray to evaluate for effusions.  We will attempt to rate control patient and discussed with hospital team for admission.  3:35 PM discussed with patient's daughter who helps make medical decisions for her.  I discussed that would recommend admission for new A. fib in order to get echocardiogram and to keep her on cardiac monitor overnight to help control rates.  Given her age family states that her thinking that they may not want aggressive care and that if we can just send her home on a rate control agent that they may prefer that.  I discussed that lets wait until we get all the rest of her labs and x-ray done and we could have another discussion about it.  CBC done earlier today at clinic shows no evidence of anemia.  Potassium was slightly low at 3.3.  Will give 40 of oral K.  Kidney function was normal  Patient was given 10 mg of diltiazem and the heart rates are still in the 130s to 140s.  Repeat EKG shows A. Fib/ maybe more like aflut? with a rate of 135, no ST elevation, no T wave inversions, similar ST depressions, normal intervals.  4:37 PM HR now <110. Will give some Oral dilt to see if we can keep down and potentially send home.    Patient's rates are to back up into the 130s to 140s.  When she slows down she appears to be in A. fib and occasionally appears to be going into a flutter.  I had a lengthy discussion with patient's daughter that given she is required 2 doses of dilt and oral diltiazem and her heart rates are still elevated that we most likely need to admit her overnight for observation, echo, so that she just doesn't come back tomorrow with elevated heart rates.  Family was comfortable with her being admitted.  She is DNR and they do not want any other aggressive treatments.  ____________________________________________   FINAL CLINICAL IMPRESSION(S) / ED DIAGNOSES   Final diagnoses:  Atrial fibrillation with rapid ventricular response ([place])      MEDICATIONS GIVEN DURING THIS VISIT:  Medications  diltiazem (CARDIZEM) 125 mg in dextrose 5% 125 mL (1 mg/mL) infusion (has no administration in  time range)  diltiazem (CARDIZEM) injection 10 mg (10 mg Intravenous Given 05/06/19 1550)  potassium chloride SA (KLOR-CON) CR tablet 40 mEq (40 mEq Oral Given 05/06/19 1626)  diltiazem (CARDIZEM) injection 20 mg (20 mg Intravenous Given 05/06/19 1628)  diltiazem (CARDIZEM) tablet 60 mg (60 mg Oral Given 05/06/19 1714)     ED Discharge Orders    None       Note:  This document was prepared using Dragon voice recognition software and may include unintentional dictation errors.   Vanessa [place], MD 05/06/19 [phone]

## 2019-05-07 ENCOUNTER — Encounter: Payer: Self-pay | Admitting: Internal Medicine

## 2019-05-07 ENCOUNTER — Observation Stay
Admit: 2019-05-07 | Discharge: 2019-05-07 | Disposition: A | Discharge status: Home / Self Care | Payer: Medicare Other | Attending: Internal Medicine | Admitting: Internal Medicine

## 2019-05-07 DIAGNOSIS — I4891 Unspecified atrial fibrillation: Secondary | ICD-10-CM | POA: Diagnosis not present

## 2019-05-07 LAB — CBC
HCT: 43.5 % (ref 36.0–46.0)
Hemoglobin: 13.5 g/dL (ref 12.0–15.0)
MCH: 27.6 pg (ref 26.0–34.0)
MCHC: 31 g/dL (ref 30.0–36.0)
MCV: 89 fL (ref 80.0–100.0)
Platelets: 382 10*3/uL (ref 150–400)
RBC: 4.89 MIL/uL (ref 3.87–5.11)
RDW: 16.8 % — ABNORMAL HIGH (ref 11.5–15.5)
WBC: 10.4 10*3/uL (ref 4.0–10.5)
nRBC: 0 % (ref 0.0–0.2)

## 2019-05-07 LAB — BASIC METABOLIC PANEL
Anion gap: 9 (ref 5–15)
BUN: 15 mg/dL (ref 8–23)
CO2: 28 mmol/L (ref 22–32)
Calcium: 9.3 mg/dL (ref 8.9–10.3)
Chloride: 103 mmol/L (ref 98–111)
Creatinine, Ser: 0.81 mg/dL (ref 0.44–1.00)
GFR calc Af Amer: >60 mL/min (ref >60)
GFR calc non Af Amer: >60 mL/min (ref >60)
Glucose, Bld: 106 mg/dL — ABNORMAL HIGH (ref 70–99)
Potassium: 3.6 mmol/L (ref 3.5–5.1)
Sodium: 140 mmol/L (ref 135–145)

## 2019-05-07 LAB — MRSA PCR SCREENING: MRSA by PCR: NEGATIVE

## 2019-05-07 LAB — ECHOCARDIOGRAM COMPLETE: Weight: 2607.99 oz

## 2019-05-07 MED ORDER — HYDROCHLOROTHIAZIDE 25 MG PO TABS: 12.5000 mg | ORAL_TABLET | Freq: Every day | ORAL | 3 refills | Status: AC

## 2019-05-07 MED ORDER — METOPROLOL TARTRATE 25 MG PO TABS
25.0000 mg | ORAL_TABLET | Freq: Two times a day (BID) | ORAL | Status: DC
  Administered 2019-05-07: 14:00:00 25 mg via ORAL
  Filled 2019-05-07: qty 1

## 2019-05-07 MED ORDER — ENOXAPARIN SODIUM 40 MG/0.4ML ~~LOC~~ SOLN
40.0000 mg | SUBCUTANEOUS | Status: DC
  Administered 2019-05-07: 09:00:00 40 mg via SUBCUTANEOUS
  Filled 2019-05-07: qty 0.4

## 2019-05-07 MED ORDER — METOPROLOL TARTRATE 25 MG PO TABS: 25.0000 mg | ORAL_TABLET | Freq: Two times a day (BID) | ORAL | 0 refills | Status: AC

## 2019-05-07 NOTE — TOC Initial Note (Signed)
Transition of Care [facility]) - Initial/Assessment Note    Patient Details  Name: Tina Bond MRN: [id number] Date of Birth: 1920/10/12  [facility] CM/SW Contact:    Eileen Stanford, LCSW Phone Number: 05/07/2019, 2:25 PM  Clinical Narrative:    Pt is disoriented to situation. CSW spoke with pt's daughter. Pt will return to [place]. Nanine Means will pick up pt at 3:30. RN to call report to [phone].           Expected Discharge Plan: Home/Self Care Barriers to Discharge: No Barriers Identified   Patient Goals and CMS Choice Patient states their goals for this hospitalization and ongoing recovery are:: to go home- per daughter      Expected Discharge Plan and Services Expected Discharge Plan: Home/Self Care     Post Acute Care Choice: NA Living arrangements for the past 2 months: [place] Expected Discharge Date: 05/07/19                                    Prior Living Arrangements/Services Living arrangements for the past 2 months: [place] Lives with:: Self Patient language and need for interpreter reviewed:: Yes Do you feel safe going back to the place where you live?: Yes      Need for Family Participation in Patient Care: Yes (Comment) Care giver support system in place?: Yes (comment)   Criminal Activity/Legal Involvement Pertinent to Current Situation/Hospitalization: No - Comment as needed  Activities of Daily Living Home Assistive Devices/Equipment: None(from nursing home) ADL Screening (condition at time of admission) Patient's cognitive ability adequate to safely complete daily activities?: Yes Is the patient deaf or have difficulty hearing?: Yes Does the patient have difficulty seeing, even when wearing glasses/contacts?: No Does the patient have difficulty concentrating, remembering, or making decisions?: No Patient able to express need for assistance with ADLs?: Yes Does the patient have difficulty  dressing or bathing?: Yes Independently performs ADLs?: Yes (appropriate for developmental age) Does the patient have difficulty walking or climbing stairs?: Yes Weakness of Legs: None Weakness of Arms/Hands: None  Permission Sought/Granted Permission sought to share information with : Family Supports    Share Information with NAME: Tina Bond  Permission granted to share info w AGENCY: [place]  Permission granted to share info w Relationship: daughter     Emotional Assessment Appearance:: Appears stated age Attitude/Demeanor/Rapport: Unable to Assess Affect (typically observed): Unable to Assess Orientation: : Oriented to Self, Oriented to Place, Oriented to  Time Alcohol / Substance Use: Not Applicable Psych Involvement: No (comment)  Admission diagnosis:  New onset a-fib ([place]) [I48.91] Atrial fibrillation with rapid ventricular response ([facility]) [I48.91] Atrial fibrillation with RVR ([place]) [I48.91] Patient Active Problem List   Diagnosis Date Noted  . Atrial fibrillation with RVR ([place]) 05/06/2019  . Fall 06/03/2016  . Healthcare maintenance 12/31/2015  . Elevated alkaline phosphatase level 12/21/2013  . Environmental allergies 12/16/2012  . Arthritis 12/16/2012  . Polycythemia vera ([place]) 06/12/2012  . Depression 06/12/2012  . Diverticulosis 06/12/2012  . Hypercholesterolemia 06/10/2012  . Essential hypertension, benign 06/10/2012   PCP:  Einar Pheasant, MD Pharmacy:   [facility] [address] [place] [address] Phone: [phone] Fax: [phone]  [facility] [place], [place] [address] [employer] [place] [address] Phone: [phone] Fax: [phone]  [facility]  [place], [place] [address] [address] Phone: [phone] Fax: [phone]     Social Determinants of Health (SDOH) Interventions    Readmission  Risk Interventions No flowsheet data found.

## 2019-05-07 NOTE — ED Notes (Signed)
Pt sleeping soundly.

## 2019-05-07 NOTE — Progress Notes (Signed)
Called and gave report to the receiving facility nurse at this time. All questions answered. Kordell Jafri M Gursimran Litaker 

## 2019-05-07 NOTE — ED Notes (Signed)
Patient is resting comfortably. 

## 2019-05-07 NOTE — Discharge Summary (Signed)
Physician Discharge Summary  Tina Bond [id number] DOB: 09-21-1920 DOA: 05/06/2019  PCP: Einar Pheasant, MD  Admit date: 05/06/2019 Discharge date: 05/07/2019  Admitted From: [facility] Disposition: [place]  Recommendations for Outpatient Follow-up:  1. Follow up with PCP in 1-2 weeks 2. Follow-up with cardiology. 3. Please obtain BMP/CBC in one week 4. Please follow up on the following pending results:  Home Health: No Equipment/Devices: None Discharge Condition: Stable CODE STATUS: DNR Diet recommendation: Heart Healthy   Brief/Interim Summary: Tina Bond is a 86 y.o. female with medical history significant for polycythemia vera, CAD, hypertension, hyperlipidemia, osteoarthritis, and hard of hearing who presents to the ED for evaluation of apparent new onset atrial fibrillation with RVR.Labs notable for potassium 3.3, magnesium 2.0, sodium 139, bicarb 28, BUN 18, creatinine 0.88, serum glucose 141, WBC 9.4, hemoglobin 13.0, platelets 409,000, TSH 4.765, free T4 0.72, high-sensitivity troponin I 13x2, BNP 243. She was initially managed with diltiazem infusion. She was converted back to sinus rhythm next morning.  Heart rate remained in low 100.  Per chart review she has similar readings during her doctor visits. She has a [place] vasc score of 4.  We did not start her on on any anticoagulation due to her age and risk of fall.  Discussed with daughter and she also wants minimal intervention.  She was started on low-dose metoprolol 25 mg twice daily, also advised to follow-up with cardiology for further management. Decrease the dose of HCTZ in order to prevent hypotension with the start of metoprolol.  He has an echocardiogram done during hospitalization which shows hyperdynamic ejection fraction at 70 to 75% with grade 1 diastolic dysfunction.  She can continue rest of her home meds.  Discharge Diagnoses:  Principal Problem:   Atrial fibrillation with RVR ([place]) Active  Problems:   Essential hypertension, benign   Polycythemia vera ([place])  Discharge Instructions  Discharge Instructions    Amb referral to [facility]   Complete by: As directed    Diet - low sodium heart healthy   Complete by: As directed    Discharge instructions   Complete by: As directed    It was pleasure taking care of you. I am starting you on a new medicine called metoprolol to keep your heart rate under good control. I am also decreasing the dose of HCTZ so you will not have much drop in your blood pressure with the addition of this new medicine. We are not starting you on any blood thinner because of your age and risk of fall. Please follow-up with cardiology and your PCP.   Increase activity slowly   Complete by: As directed      Allergies as of 05/07/2019      Reactions   Penicillins    Sulfa Antibiotics       Medication List    TAKE these medications   acetaminophen 500 MG tablet Commonly known as: TYLENOL Take 500 mg by mouth as needed for pain.   Artificial Tears 0.4 % Soln Generic drug: Hypromellose Apply 1 drop to eye daily at 12 noon.   aspirin 81 MG tablet Take 81 mg by mouth daily.   docusate sodium 100 MG capsule Commonly known as: COLACE Take 100 mg by mouth daily.   folic acid 1 MG tablet Commonly known as: FOLVITE Take 1 mg by mouth daily.   hydrochlorothiazide 25 MG tablet Commonly known as: HYDRODIURIL Take 0.5 tablets (12.5 mg total) by mouth daily. What changed: how much to take  hydroxyurea 500 MG capsule Commonly known as: HYDREA TAKE ONE CAPSULE BY MOUTH DAILY EXCEPT ON TUESDAY, TAKE TWO CAPSULES What changed: See the new instructions.   metoprolol tartrate 25 MG tablet Commonly known as: LOPRESSOR Take 1 tablet (25 mg total) by mouth 2 (two) times daily.   MULTIVITAL PO Take 1 tablet by mouth daily.       Allergies  Allergen Reactions  . Penicillins   . Sulfa Antibiotics      Consultations:  None  Procedures/Studies: DG Chest Port 1 View  Result Date: 05/06/2019 CLINICAL DATA:  History of atrial fibrillation and hypertension EXAM: PORTABLE CHEST 1 VIEW COMPARISON:  01/21/2008 CT of the chest FINDINGS: Cardiac shadow is at the upper limits of normal in size. Aortic calcifications are seen. The lungs are well aerated bilaterally. Previously seen pulmonary nodules are not well visualized. Slight increased density is noted within the right hemithorax which may be related to patient rotation and overlying soft tissue. No focal confluent infiltrate is seen. IMPRESSION: Slight increased density in the right chest likely related to patient rotation and overlying soft tissue density. No focal infiltrate is seen. Electronically Signed   By: Inez Catalina M.D.   On: 05/06/2019 15:46   ECHOCARDIOGRAM COMPLETE  Result Date: 05/07/2019   ECHOCARDIOGRAM REPORT   Patient Name:   Tina Bond Date of Exam: 05/07/2019 Medical Rec #:  [id number]        Height:       65.0 in Accession #:    [id number]       Weight:       163.0 lb Date of Birth:  [birth year]        BSA:          1.81 m Patient Age:    20 years         BP:           132/83 mmHg Patient Gender: F                HR:           111 bpm. Exam Location:  [facility] Procedure: 2D Echo, Cardiac Doppler and Color Doppler Indications:     Atrial fibrillation 427.31  History:         Patient has no prior history of Echocardiogram examinations.                  CAD; Risk Factors:Hypertension.  Sonographer:     Sherrie Sport RDCS (AE) Referring Phys:  [id number] [place] Diagnosing Phys: Bartholome Bill MD  Sonographer Comments: Technically challenging study due to limited acoustic windows, no apical window and suboptimal parasternal window. IMPRESSIONS  1. Left ventricular ejection fraction, by visual estimation, is 70 to 75%. The left ventricle has hyperdynamic function. Left ventricular septal wall thickness was mildly increased. Mildly increased  left ventricular posterior wall thickness. There is mildly increased left ventricular hypertrophy.  2. Left ventricular diastolic parameters are consistent with Grade I diastolic dysfunction (impaired relaxation).  3. The left ventricle has no regional wall motion abnormalities.  4. Global right ventricle has normal systolic function.The right ventricular size is normal. No increase in right ventricular wall thickness.  5. Left atrial size was mildly dilated.  6. Right atrial size was normal.  7. The mitral valve was not well visualized. Trivial mitral valve regurgitation.  8. The tricuspid valve is not well visualized.  9. The aortic valve was not well visualized. Aortic valve regurgitation is trivial. 10.  The pulmonic valve was not well visualized. Pulmonic valve regurgitation is trivial. 11. The aortic root was not well visualized. 12. Mildly elevated pulmonary artery systolic pressure. 13. The atrial septum is grossly normal. FINDINGS  Left Ventricle: Left ventricular ejection fraction, by visual estimation, is 70 to 75%. The left ventricle has hyperdynamic function. The left ventricle has no regional wall motion abnormalities. Mildly increased left ventricular posterior wall thickness. There is mildly increased left ventricular hypertrophy. Left ventricular diastolic parameters are consistent with Grade I diastolic dysfunction (impaired relaxation). Right Ventricle: The right ventricular size is normal. No increase in right ventricular wall thickness. Global RV systolic function is has normal systolic function. The tricuspid regurgitant velocity is 2.44 m/s, and with an assumed right atrial pressure  of 10 mmHg, the estimated right ventricular systolic pressure is mildly elevated at 33.7 mmHg. Left Atrium: Left atrial size was mildly dilated. Right Atrium: Right atrial size was normal in size Pericardium: There is no evidence of pericardial effusion. Mitral Valve: The mitral valve was not well visualized.  Trivial mitral valve regurgitation. Tricuspid Valve: The tricuspid valve is not well visualized. Tricuspid valve regurgitation is trivial. Aortic Valve: The aortic valve was not well visualized. Aortic valve regurgitation is trivial. Pulmonic Valve: The pulmonic valve was not well visualized. Pulmonic valve regurgitation is trivial. Pulmonic regurgitation is trivial. Aorta: The aortic root was not well visualized. IAS/Shunts: The atrial septum is grossly normal.  LEFT VENTRICLE PLAX 2D LVIDd:         3.70 cm LVIDs:         2.22 cm LV PW:         1.06 cm LV IVS:        0.71 cm LVOT diam:     2.00 cm LV SV:         42 ml LV SV Index:   22.36 LVOT Area:     3.14 cm  LEFT ATRIUM         Index LA diam:    4.40 cm 2.43 cm/m                        PULMONIC VALVE AORTA                 PV Vmax:        0.84 m/s Ao Root diam: 3.40 cm PV Peak grad:   2.8 mmHg                       RVOT Peak grad: 0 mmHg  TRICUSPID VALVE TR Peak grad:   23.7 mmHg TR Vmax:        248.00 cm/s  SHUNTS Systemic Diam: 2.00 cm  Bartholome Bill MD Electronically signed by Bartholome Bill MD Signature Date/Time: 05/07/2019/12:09:08 PM    Final     Subjective: Patient was feeling better when seen this morning.  She was back to her baseline.  She is very hard of hearing and would like to go back home.  She denies any chest pain, palpitations or shortness of breath.  Discharge Exam: Vitals:   05/07/19 0630 05/07/19 1011  BP: 133/81 132/83  Pulse: (!) 108 (!) 111  Resp: 18 18  Temp:  98.1 F (36.7 C)  SpO2: 96% 96%   Vitals:   05/07/19 0530 05/07/19 0600 05/07/19 0630 05/07/19 1011  BP: 127/83 118/78 133/81 132/83  Pulse: (!) 108 (!) 110 (!) 108 (!) 111  Resp:  18 18  Temp:    98.1 F (36.7 C)  TempSrc:    Oral  SpO2: 96% 95% 96% 96%  Weight:       General: Pt is alert, awake, not in acute distress Cardiovascular: Mild sinus tachycardia, S1/S2 +, no rubs, no gallops Respiratory: CTA bilaterally, no wheezing, no rhonchi Abdominal:  Soft, NT, ND, bowel sounds + Extremities: no edema, no cyanosis   The results of significant diagnostics from this hospitalization (including imaging, microbiology, ancillary and laboratory) are listed below for reference.    Microbiology: Recent Results (from the past 240 hour(s))  Respiratory Panel by RT PCR (Flu A&B, Covid) - Nasopharyngeal Swab     Status: None   Collection Time: 05/06/19  3:20 PM   Specimen: Nasopharyngeal Swab  Result Value Ref Range Status   SARS Coronavirus 2 by RT PCR NEGATIVE NEGATIVE Final    Comment: (NOTE) SARS-CoV-2 target nucleic acids are NOT DETECTED. The SARS-CoV-2 RNA is generally detectable in upper respiratoy specimens during the acute phase of infection. The lowest concentration of SARS-CoV-2 viral copies this assay can detect is 131 copies/mL. A negative result does not preclude SARS-Cov-2 infection and should not be used as the sole basis for treatment or other patient management decisions. A negative result may occur with  improper specimen collection/handling, submission of specimen other than nasopharyngeal swab, presence of viral mutation(s) within the areas targeted by this assay, and inadequate number of viral copies (<131 copies/mL). A negative result must be combined with clinical observations, patient history, and epidemiological information. The expected result is Negative. Fact Sheet for Patients:  PinkCheek.be Fact Sheet for Healthcare Providers:  GravelBags.it This test is not yet ap proved or cleared by the [place] FDA and  has been authorized for detection and/or diagnosis of SARS-CoV-2 by FDA under an Emergency Use Authorization (EUA). This EUA will remain  in effect (meaning this test can be used) for the duration of the COVID-19 declaration under Section 564(b)(1) of the Act, 21 U.S.C. section 360bbb-3(b)(1), unless the authorization is terminated or revoked  sooner.    Influenza A by PCR NEGATIVE NEGATIVE Final   Influenza B by PCR NEGATIVE NEGATIVE Final    Comment: (NOTE) The Xpert Xpress SARS-CoV-2/FLU/RSV assay is intended as an aid in  the diagnosis of influenza from Nasopharyngeal swab specimens and  should not be used as a sole basis for treatment. Nasal washings and  aspirates are unacceptable for Xpert Xpress SARS-CoV-2/FLU/RSV  testing. Fact Sheet for Patients: PinkCheek.be Fact Sheet for Healthcare Providers: GravelBags.it This test is not yet approved or cleared by the [place] FDA and  has been authorized for detection and/or diagnosis of SARS-CoV-2 by  FDA under an Emergency Use Authorization (EUA). This EUA will remain  in effect (meaning this test can be used) for the duration of the  Covid-19 declaration under Section 564(b)(1) of the Act, 21  U.S.C. section 360bbb-3(b)(1), unless the authorization is  terminated or revoked. Performed at [facility], [address] [place], [place] [address]      Labs: BNP (last 3 results) Recent Labs    05/06/19 1519  BNP [id number]*   Basic Metabolic Panel: Recent Labs  Lab 05/06/19 1323 05/06/19 1519 05/07/19 0619  NA 139  --  140  K 3.3*  --  3.6  CL 102  --  103  CO2 28  --  28  GLUCOSE 141*  --  106*  BUN 18  --  15  CREATININE  0.88  --  0.81  CALCIUM 9.3  --  9.3  MG  --  2.0  --    Liver Function Tests: Recent Labs  Lab 05/06/19 1323  AST 18  ALT 11  ALKPHOS 92  BILITOT 0.4  PROT 8.2*  ALBUMIN 4.0   No results for input(s): LIPASE, AMYLASE in the last 168 hours. No results for input(s): AMMONIA in the last 168 hours. CBC: Recent Labs  Lab 05/06/19 1323 05/07/19 0500  WBC 9.4 10.4  NEUTROABS 6.1  --   HGB 13.0 13.5  HCT 44.0 43.5  MCV 93.6 89.0  PLT 429* 382   Cardiac Enzymes: No results for input(s): CKTOTAL, CKMB, CKMBINDEX, TROPONINI in the last 168  hours. BNP: Invalid input(s): POCBNP CBG: No results for input(s): GLUCAP in the last 168 hours. D-Dimer No results for input(s): DDIMER in the last 72 hours. Hgb A1c No results for input(s): HGBA1C in the last 72 hours. Lipid Profile No results for input(s): CHOL, HDL, LDLCALC, TRIG, CHOLHDL, LDLDIRECT in the last 72 hours. Thyroid function studies Recent Labs    05/06/19 1519  TSH 4.765*   Anemia work up No results for input(s): VITAMINB12, FOLATE, FERRITIN, TIBC, IRON, RETICCTPCT in the last 72 hours. Urinalysis No results found for: COLORURINE, APPEARANCEUR, [place], [place], [place], [place], [place], [place], PROTEINUR, UROBILINOGEN, NITRITE, LEUKOCYTESUR Sepsis Labs Invalid input(s): PROCALCITONIN,  WBC,  LACTICIDVEN Microbiology Recent Results (from the past 240 hour(s))  Respiratory Panel by RT PCR (Flu A&B, Covid) - Nasopharyngeal Swab     Status: None   Collection Time: 05/06/19  3:20 PM   Specimen: Nasopharyngeal Swab  Result Value Ref Range Status   SARS Coronavirus 2 by RT PCR NEGATIVE NEGATIVE Final    Comment: (NOTE) SARS-CoV-2 target nucleic acids are NOT DETECTED. The SARS-CoV-2 RNA is generally detectable in upper respiratoy specimens during the acute phase of infection. The lowest concentration of SARS-CoV-2 viral copies this assay can detect is 131 copies/mL. A negative result does not preclude SARS-Cov-2 infection and should not be used as the sole basis for treatment or other patient management decisions. A negative result may occur with  improper specimen collection/handling, submission of specimen other than nasopharyngeal swab, presence of viral mutation(s) within the areas targeted by this assay, and inadequate number of viral copies (<131 copies/mL). A negative result must be combined with clinical observations, patient history, and epidemiological information. The expected result is Negative. Fact Sheet for Patients:   PinkCheek.be Fact Sheet for Healthcare Providers:  GravelBags.it This test is not yet ap proved or cleared by the [place] FDA and  has been authorized for detection and/or diagnosis of SARS-CoV-2 by FDA under an Emergency Use Authorization (EUA). This EUA will remain  in effect (meaning this test can be used) for the duration of the COVID-19 declaration under Section 564(b)(1) of the Act, 21 U.S.C. section 360bbb-3(b)(1), unless the authorization is terminated or revoked sooner.    Influenza A by PCR NEGATIVE NEGATIVE Final   Influenza B by PCR NEGATIVE NEGATIVE Final    Comment: (NOTE) The Xpert Xpress SARS-CoV-2/FLU/RSV assay is intended as an aid in  the diagnosis of influenza from Nasopharyngeal swab specimens and  should not be used as a sole basis for treatment. Nasal washings and  aspirates are unacceptable for Xpert Xpress SARS-CoV-2/FLU/RSV  testing. Fact Sheet for Patients: PinkCheek.be Fact Sheet for Healthcare Providers: GravelBags.it This test is not yet approved or cleared by the [place] and  has been authorized  for detection and/or diagnosis of SARS-CoV-2 by  FDA under an Emergency Use Authorization (EUA). This EUA will remain  in effect (meaning this test can be used) for the duration of the  Covid-19 declaration under Section 564(b)(1) of the Act, 21  U.S.C. section 360bbb-3(b)(1), unless the authorization is  terminated or revoked. Performed at [facility], [address] [place], [place] [address]     Time coordinating discharge: Over 30 minutes  SIGNED:  Lorella Nimrod, MD  [facility] 05/07/2019, 2:24 PM Pager [phone]  If 7PM-7AM, please contact night-coverage [email or web address] Password [email or web address]  This record has been created using Systems analyst. Errors have been sought and corrected,but  may not always be located. Such creation errors do not reflect on the standard of care.

## 2019-05-07 NOTE — NC FL2 (Signed)
[place] LEVEL OF CARE SCREENING TOOL     IDENTIFICATION  Patient Name: Tina Bond Birthdate: November 03, 1920 Sex: female Admission Date (Current Location): 05/06/2019  [facility] and [place] Number:  Engineering geologist and Address:  [facility], [address], [place], [place] [address]      Provider Number: [id number]  Attending Physician Name and Address:  Lorella Nimrod, MD  Relative Name and Phone Number:       Current Level of Care: Hospital Recommended Level of Care: [place] Prior Approval Number:    Date Approved/Denied:   PASRR Number:    Discharge Plan: Other (Comment)(ALF)    Current Diagnoses: Patient Active Problem List   Diagnosis Date Noted  . Atrial fibrillation with RVR ([place]) 05/06/2019  . Fall 06/03/2016  . Healthcare maintenance 12/31/2015  . Elevated alkaline phosphatase level 12/21/2013  . Environmental allergies 12/16/2012  . Arthritis 12/16/2012  . Polycythemia vera ([place]) 06/12/2012  . Depression 06/12/2012  . Diverticulosis 06/12/2012  . Hypercholesterolemia 06/10/2012  . Essential hypertension, benign 06/10/2012    Orientation RESPIRATION BLADDER Height & Weight     Self, Time, Place  Normal Incontinent, External catheter(placed 1/6) Weight: 163 lb (73.9 kg) Height:     BEHAVIORAL SYMPTOMS/MOOD NEUROLOGICAL BOWEL NUTRITION STATUS      Continent Diet  AMBULATORY STATUS COMMUNICATION OF NEEDS Skin   Limited Assist Verbally Normal                       Personal Care Assistance Level of Assistance  Bathing, Feeding, Dressing Bathing Assistance: Limited assistance Feeding assistance: Independent Dressing Assistance: Limited assistance     Functional Limitations Info  Sight, Hearing, Speech Sight Info: Adequate Hearing Info: Adequate Speech Info: Adequate    SPECIAL CARE FACTORS FREQUENCY  PT (By licensed PT)                    Contractures Contractures Info:  Not present    Additional Factors Info  Code Status, Allergies Code Status Info: DNR Allergies Info: Penicillins, Sulfa Antibiotics           Current Medications (05/07/2019):  This is the current hospital active medication list Current Facility-Administered Medications  Medication Dose Route Frequency Provider Last Rate Last Admin  . acetaminophen (TYLENOL) tablet 650 mg  650 mg Oral Q4H PRN Zada Finders R, MD      . aspirin chewable tablet 81 mg  81 mg Oral Daily Lenore Cordia, MD   81 mg at 05/07/19 0913  . enoxaparin (LOVENOX) injection 40 mg  40 mg Subcutaneous Q24H Lu Duffel, RPH   40 mg at 05/07/19 0913  . folic acid (FOLVITE) tablet 1 mg  1 mg Oral Daily Zada Finders R, MD   1 mg at 05/07/19 0913  . hydrochlorothiazide (HYDRODIURIL) tablet 25 mg  25 mg Oral Daily Zada Finders R, MD   25 mg at 05/07/19 0913  . hydroxyurea (HYDREA) capsule 500 mg  500 mg Oral Daily Lenore Cordia, MD   500 mg at 05/07/19 0924  . metoprolol tartrate (LOPRESSOR) tablet 25 mg  25 mg Oral BID Lorella Nimrod, MD      . ondansetron (ZOFRAN) injection 4 mg  4 mg Intravenous Q6H PRN Lenore Cordia, MD         Discharge Medications: Please see discharge summary for a list of discharge medications.  Relevant Imaging Results:  Relevant Lab Results:  Additional Information SSN:[id number]  Eileen Stanford, LCSW

## 2019-05-07 NOTE — Plan of Care (Signed)
  Problem: Education: Goal: Knowledge of General Education information will improve Description: Including pain rating scale, medication(s)/side effects and non-pharmacologic comfort measures Outcome: Not Progressing Note: Patient is extremely HOH. No hearing aides available, per patient. Will continue to monitor. Wenda Low [facility]

## 2019-05-07 NOTE — Progress Notes (Signed)
*  PRELIMINARY RESULTS* Echocardiogram 2D Echocardiogram has been performed.  Tina Bond 05/07/2019, 10:56 AM

## 2019-05-07 NOTE — Progress Notes (Signed)
Report given to [facility] and patient transported to room 243.

## 2019-05-07 NOTE — Discharge Instructions (Signed)

## 2019-05-07 NOTE — ED Notes (Signed)
Pt yelling, "Yoo-Hoo" repeatedly from room. This tech went in room to check on pt. Pt in NAD and stating that she has to urinate. Reminded pt that she has external catheter (PureWick) in place. Pt unable to urinate at this time. Pt repositioned in bed and checked brief to ensure cleanliness. Brief dry. Call bell within reach.

## 2019-05-19 ENCOUNTER — Other Ambulatory Visit: Payer: Self-pay

## 2019-05-19 ENCOUNTER — Ambulatory Visit (HOSPITAL_COMMUNITY)
Admission: RE | Admit: 2019-05-19 | Discharge: 2019-05-19 | Disposition: A | Discharge status: Home / Self Care | Payer: Medicare Other | Source: Ambulatory Visit | Attending: Physician Assistant | Admitting: Physician Assistant

## 2019-05-19 VITALS — BP 138/62 | HR 62

## 2019-05-19 DIAGNOSIS — I480 Paroxysmal atrial fibrillation: Secondary | ICD-10-CM | POA: Diagnosis not present

## 2019-05-19 DIAGNOSIS — D6869 Other thrombophilia: Secondary | ICD-10-CM | POA: Diagnosis not present

## 2019-05-19 NOTE — Progress Notes (Signed)
Electrophysiology TeleHealth Note   Due to national recommendations of social distancing due to [place] 19, Audio/video telehealth visit is felt to be most appropriate for this patient at this time.  See consent below from today for patient consent regarding telehealth for the At[facility] F[facility]. Consent obtained verbally.   Date:  05/19/2019   ID:  Tina Bond, DOB Nov 29, 1920, MRN [id number]  Location: home  Provider location: [address] [place], [place] [address] Evaluation Performed: New patient consult  PCP:  Einar Pheasant, MD  Primary Cardiologist:  none Primary Electrophysiologist: none Referring Physician: Dr Reesa Chew   CC: Consultation for new onset atrial fibrillation.   History of Present Illness: Tina Bond is a 86 y.o. female with medical history significant for polycythemia vera, CAD, hypertension, hyperlipidemia, osteoarthritis, and hard of hearing who presents via audio/video conferencing for a telehealth visit today with the [facility]. Patient was seen at her oncologist office for routine follow-up of her polycythemia vera where she was noted to have a rapid heart rate in the 140s and was advised to present to the ED for further evaluation and management.  Patient has no known prior history of atrial fibrillation or arrhythmia. She was found to be hypokalemic at 3.3. Patient was admitted for workup and converted to SR the next morning. She was started on Lopressor 25 mg BID. She has a CHADS2VASC score of 5 but anticoagulation was deferred given patient's age and fall risk per the family's wishes. Echocardiogram done during hospitalization which shows hyperdynamic ejection fraction at 70 to 75% with grade 1 diastolic dysfunction. Since her hospitalization, patient reports doing very well. The RN with her today also states that the patient appears to be at her baseline. Her heart rates have been well controlled in the  60s.  Today, she denies symptoms of palpitations, chest pain, shortness of breath, orthopnea, PND, lower extremity edema, claudication, dizziness, presyncope, syncope, bleeding, or neurologic sequela. The patient is tolerating medications without difficulties and is otherwise without complaint today.    Atrial Fibrillation Risk Factors:  she does not have symptoms or diagnosis of sleep apnea. she does not have a history of rheumatic fever.   she has a BMI of There is no height or weight on file to calculate BMI.  BP 138/62 Pulse 62 Performed by staff at nursing facility  Past Medical History:  Diagnosis Date  . Coronary artery disease    Status post heart cath with two insignificant vessel blockages, patient chose medical management  . History of Clostridium difficile infection    After antibiotics of a dental procedure  . History of degenerative disc disease   . Hypertension   . Osteoarthritis   . Polycythemia vera(238.4)     GWK (Hydrea)  . Pure hypercholesterolemia    Past Surgical History:  Procedure Laterality Date  . ABDOMINAL HYSTERECTOMY  1970   ovaries left in place  . APPENDECTOMY     performed with hysterectomy  . BONE MARROW BIOPSY  08/2008  . CATARACT EXTRACTION  03/06/11   . CHOLECYSTECTOMY       Current Outpatient Medications  Medication Sig Dispense Refill  . acetaminophen (TYLENOL) 500 MG tablet Take 500 mg by mouth as needed for pain.    Marland Kitchen aspirin 81 MG tablet Take 81 mg by mouth daily.    Marland Kitchen docusate sodium (COLACE) 100 MG capsule Take 100 mg by mouth daily.    . folic acid (FOLVITE) 1 MG  tablet Take 1 mg by mouth daily.    . hydrochlorothiazide (HYDRODIURIL) 25 MG tablet Take 0.5 tablets (12.5 mg total) by mouth daily. 90 tablet 3  . hydroxyurea (HYDREA) 500 MG capsule TAKE ONE CAPSULE BY MOUTH DAILY EXCEPT ON TUESDAY, TAKE TWO CAPSULES (Patient taking differently: Take 500 mg by mouth daily. ) 35 capsule 6  . Hypromellose (ARTIFICIAL TEARS) 0.4 %  SOLN Apply 1 drop to eye daily at 12 noon.    . metoprolol tartrate (LOPRESSOR) 25 MG tablet Take 1 tablet (25 mg total) by mouth 2 (two) times daily. 60 tablet 0  . Multiple Vitamins-Minerals (MULTIVITAL PO) Take 1 tablet by mouth daily.     No current facility-administered medications for this encounter.    Allergies:   Penicillins and Sulfa antibiotics   Social History:  The patient  reports that she has never smoked. She has never used smokeless tobacco. She reports that she does not drink alcohol or use drugs.   Family History:  The patient's  family history includes Hypertension in her mother.    ROS:  Please see the history of present illness.   All other systems are personally reviewed and negative.   Exam: Well appearing, alert and conversant, regular work of breathing,  good skin color Eyes- anicteric, neuro- grossly intact, skin- no apparent rash or lesions or cyanosis, mouth- oral mucosa is pink Hard of hearing  Recent Labs: 05/06/2019: ALT 11; B Natriuretic Peptide 243.0; Magnesium 2.0; TSH 4.765 05/07/2019: BUN 15; Creatinine, Ser 0.81; Hemoglobin 13.5; Platelets 382; Potassium 3.6; Sodium 140  personally reviewed    Other studies personally reviewed: Additional studies/ records that were reviewed today include: Epic notes, echocardiogram.  Echo 05/07/19  1. Left ventricular ejection fraction, by visual estimation, is 70 to 75%. The left ventricle has hyperdynamic function. Left ventricular septal wall thickness was mildly increased. Mildly increased left ventricular posterior wall thickness. There is  mildly increased left ventricular hypertrophy.  2. Left ventricular diastolic parameters are consistent with Grade I diastolic dysfunction (impaired relaxation).  3. The left ventricle has no regional wall motion abnormalities.  4. Global right ventricle has normal systolic function.The right ventricular size is normal. No increase in right ventricular wall thickness.  5.  Left atrial size was mildly dilated.  6. Right atrial size was normal.  7. The mitral valve was not well visualized. Trivial mitral valve regurgitation.  8. The tricuspid valve is not well visualized.  9. The aortic valve was not well visualized. Aortic valve regurgitation is trivial. 10. The pulmonic valve was not well visualized. Pulmonic valve regurgitation is trivial. 11. The aortic root was not well visualized. 12. Mildly elevated pulmonary artery systolic pressure. 13. The atrial septum is grossly normal.   ASSESSMENT AND PLAN:  1. Paroxysmal atrial fibrillation General education about afib discussed and questions answered. We also discussed her stroke risk and the risks and benefits of anticoagulation. Anticoagulation has been deferred given patient's age, fall risk, and patient's and family's wishes.  Continue Lopressor 25 mg BID. Would favor rate control given advanced age.  This patients CHA2DS2-VASc Score and unadjusted Ischemic Stroke Rate (% per year) is equal to 7.2 % stroke rate/year from a score of 5  Above score calculated as 1 point each if present [CHF, HTN, DM, Vascular=MI/PAD/Aortic Plaque, Age if 65-74, or Female] Above score calculated as 2 points each if present [Age > 75, or Stroke/TIA/TE]  2. HTN No changes today.  3. CAD No anginal symptoms.  Follow-up with PCP. AF clinic as needed.   Current medicines are reviewed at length with the patient today.   The patient does not have concerns regarding her medicines.  The following changes were made today:  none  Labs/ tests ordered today include:  No orders of the defined types were placed in this encounter.   Patient Risk:  after full review of this patients clinical status, I feel that they are at moderate risk at this time.   Today, I have spent 9 minutes with the patient with telehealth technology discussing the above.    Gwenlyn Perking PA-C 05/19/2019 11:11 AM  [facility] [address] [place], [place] [address] [phone]   I hereby voluntarily request, consent and authorize the [facility] and its employed or contracted physicians, physician assistants, nurse practitioners or other licensed health care professionals (the Practitioner), to provide me with telemedicine health care services (the "Services") as deemed necessary by the treating Practitioner. I acknowledge and consent to receive the Services by the Practitioner via telemedicine. I understand that the telemedicine visit will involve communicating with the Practitioner through live audiovisual communication technology and the disclosure of certain medical information by electronic transmission. I acknowledge that I have been given the opportunity to request an in-person assessment or other available alternative prior to the telemedicine visit and am voluntarily participating in the telemedicine visit.   I understand that I have the right to withhold or withdraw my consent to the use of telemedicine in the course of my care at any time, without affecting my right to future care or treatment, and that the Practitioner or I may terminate the telemedicine visit at any time. I understand that I have the right to inspect all information obtained and/or recorded in the course of the telemedicine visit and may receive copies of available information for a reasonable fee.  I understand that some of the potential risks of receiving the Services via telemedicine include:   Delay or interruption in medical evaluation due to technological equipment failure or disruption;  Information transmitted may not be sufficient (e.g. poor resolution of images) to allow for appropriate medical decision making by the Practitioner; and/or  In rare instances, security protocols could fail, causing a breach of personal health information.   Furthermore, I acknowledge that it is my responsibility to provide  information about my medical history, conditions and care that is complete and accurate to the best of my ability. I acknowledge that Practitioner's advice, recommendations, and/or decision may be based on factors not within their control, such as incomplete or inaccurate data provided by me or distortions of diagnostic images or specimens that may result from electronic transmissions. I understand that the practice of medicine is not an exact science and that Practitioner makes no warranties or guarantees regarding treatment outcomes. I acknowledge that I will receive a copy of this consent concurrently upon execution via email to the email address I last provided but may also request a printed copy by calling the office of the [facility].  I understand that my insurance will be billed for this visit.   I have read or had this consent read to me.  I understand the contents of this consent, which adequately explains the benefits and risks of the Services being provided via telemedicine.  I have been provided ample opportunity to ask questions regarding this consent and the Services and have had my questions answered to my satisfaction.  I give my informed consent for the services to be provided through the use of telemedicine in my medical care  By participating in this telemedicine visit I agree to the above.

## 2019-06-10 ENCOUNTER — Emergency Department: Payer: Medicare Other

## 2019-06-10 ENCOUNTER — Encounter: Payer: Self-pay | Admitting: Emergency Medicine

## 2019-06-10 ENCOUNTER — Other Ambulatory Visit: Payer: Self-pay

## 2019-06-10 ENCOUNTER — Emergency Department
Admission: EM | Admit: 2019-06-10 | Discharge: 2019-06-10 | Disposition: A | Discharge status: Home / Self Care | Payer: Medicare Other | Attending: Emergency Medicine | Admitting: Emergency Medicine

## 2019-06-10 DIAGNOSIS — I10 Essential (primary) hypertension: Secondary | ICD-10-CM | POA: Diagnosis not present

## 2019-06-10 DIAGNOSIS — N390 Urinary tract infection, site not specified: Secondary | ICD-10-CM | POA: Diagnosis not present

## 2019-06-10 DIAGNOSIS — I2510 Atherosclerotic heart disease of native coronary artery without angina pectoris: Secondary | ICD-10-CM | POA: Insufficient documentation

## 2019-06-10 DIAGNOSIS — Z7982 Long term (current) use of aspirin: Secondary | ICD-10-CM | POA: Diagnosis not present

## 2019-06-10 DIAGNOSIS — Z79899 Other long term (current) drug therapy: Secondary | ICD-10-CM | POA: Insufficient documentation

## 2019-06-10 DIAGNOSIS — F0390 Unspecified dementia without behavioral disturbance: Secondary | ICD-10-CM | POA: Diagnosis not present

## 2019-06-10 DIAGNOSIS — R410 Disorientation, unspecified: Secondary | ICD-10-CM | POA: Diagnosis present

## 2019-06-10 LAB — URINALYSIS, ROUTINE W REFLEX MICROSCOPIC
Bilirubin Urine: NEGATIVE
Glucose, UA: NEGATIVE mg/dL
Ketones, ur: NEGATIVE mg/dL
Nitrite: NEGATIVE
Protein, ur: 100 mg/dL — AB
Specific Gravity, Urine: 1.016 (ref 1.005–1.030)
pH: 5 (ref 5.0–8.0)

## 2019-06-10 LAB — COMPREHENSIVE METABOLIC PANEL
ALT: 9 U/L (ref 0–44)
AST: 25 U/L (ref 15–41)
Albumin: 3.6 g/dL (ref 3.5–5.0)
Alkaline Phosphatase: 86 U/L (ref 38–126)
Anion gap: 12 (ref 5–15)
BUN: 16 mg/dL (ref 8–23)
CO2: 24 mmol/L (ref 22–32)
Calcium: 8.8 mg/dL — ABNORMAL LOW (ref 8.9–10.3)
Chloride: 101 mmol/L (ref 98–111)
Creatinine, Ser: 0.91 mg/dL (ref 0.44–1.00)
GFR calc Af Amer: >60 mL/min (ref >60)
GFR calc non Af Amer: 53 mL/min — ABNORMAL LOW (ref >60)
Glucose, Bld: 103 mg/dL — ABNORMAL HIGH (ref 70–99)
Potassium: 4.1 mmol/L (ref 3.5–5.1)
Sodium: 137 mmol/L (ref 135–145)
Total Bilirubin: 0.7 mg/dL (ref 0.3–1.2)
Total Protein: 7.3 g/dL (ref 6.5–8.1)

## 2019-06-10 LAB — CBC WITH DIFFERENTIAL/PLATELET
Abs Immature Granulocytes: 0.04 10*3/uL (ref 0.00–0.07)
Basophils Absolute: 0.2 10*3/uL — ABNORMAL HIGH (ref 0.0–0.1)
Basophils Relative: 2 %
Eosinophils Absolute: 0.1 10*3/uL (ref 0.0–0.5)
Eosinophils Relative: 1 %
HCT: 43.1 % (ref 36.0–46.0)
Hemoglobin: 13.3 g/dL (ref 12.0–15.0)
Immature Granulocytes: 0 %
Lymphocytes Relative: 22 %
Lymphs Abs: 2.1 10*3/uL (ref 0.7–4.0)
MCH: 28.3 pg (ref 26.0–34.0)
MCHC: 30.9 g/dL (ref 30.0–36.0)
MCV: 91.7 fL (ref 80.0–100.0)
Monocytes Absolute: 1.2 10*3/uL — ABNORMAL HIGH (ref 0.1–1.0)
Monocytes Relative: 13 %
Neutro Abs: 5.7 10*3/uL (ref 1.7–7.7)
Neutrophils Relative %: 62 %
Platelets: 215 10*3/uL (ref 150–400)
RBC: 4.7 MIL/uL (ref 3.87–5.11)
RDW: 17.3 % — ABNORMAL HIGH (ref 11.5–15.5)
WBC: 9.3 10*3/uL (ref 4.0–10.5)
nRBC: 0 % (ref 0.0–0.2)

## 2019-06-10 MED ORDER — HYDROCHLOROTHIAZIDE 25 MG PO TABS: 12.5000 mg | ORAL_TABLET | Freq: Once | ORAL | Status: DC

## 2019-06-10 MED ORDER — SODIUM CHLORIDE 0.9 % IV SOLN
1.0000 g | Freq: Once | INTRAVENOUS | Status: AC
  Administered 2019-06-10: 1 g via INTRAVENOUS
  Filled 2019-06-10: qty 10

## 2019-06-10 MED ORDER — CEPHALEXIN 250 MG PO CAPS: 250.0000 mg | ORAL_CAPSULE | Freq: Four times a day (QID) | ORAL | 0 refills | Status: AC

## 2019-06-10 NOTE — Discharge Instructions (Addendum)
Patient's work-up was reassuring.  Her CT scan did not show any bleeding in her brain.  Her labs were normal.  Her urine was difficult to interpret but given the increased frequency I think it would be okay to give her a short course of antibiotics in case she does have a UTI.  I discussed with the daughter that even if she had a stroke that it was mild in nature given she seems to be at her baseline moving all extremities and acting her normal self.  She is at high risk for strokes due to her new atrial fibrillation but she is not a good candidate for stronger blood thinners due to her fall risk.   Return to the ER for fevers or any other concerns

## 2019-06-10 NOTE — ED Triage Notes (Signed)
Patient arrives from [place] via [place]. Per EMS, they were called out by staff due to patient leaning to one side in bed and having tremors. Upon further assessment, patient was found to have HTN. Patient has no complaints upon arrival. Stroke screen negative. Patient oriented at baseline per facility.

## 2019-06-10 NOTE — ED Provider Notes (Signed)
[facility] Emergency Department Provider Note  ____________________________________________   First MD Initiated Contact with Patient 06/10/19 1523     (approximate)  I have reviewed the triage vital signs and the nursing notes.   HISTORY  Chief Complaint Hypertension    HPI Tina Bond is a 86 y.o. female with recent diagnosis of afib with dementia who comes in for concern for stroke.  She had increased Urine output. She was more confused then normal.  Started today, intermittent, nothing makes better, nothing makes it worse. Seemed to be leaning towards the left.  Her vitals show HR 112 and primary doc told them to bring them here.   Per EMS she was sent due to leaning towards the left and concern she may be having a stroke.    Pt herself feels great.  She denies any concerns. No abd pain, chest pain, or sob.    Past Medical History:  Diagnosis Date  . Coronary artery disease    Status post heart cath with two insignificant vessel blockages, patient chose medical management  . History of Clostridium difficile infection    After antibiotics of a dental procedure  . History of degenerative disc disease   . Hypertension   . Osteoarthritis   . Polycythemia vera(238.4)     GWK (Hydrea)  . Pure hypercholesterolemia     Patient Active Problem List   Diagnosis Date Noted  . Paroxysmal atrial fibrillation ([place]) 05/19/2019  . Secondary hypercoagulable state ([place]) 05/19/2019  . Atrial fibrillation with RVR ([place]) 05/06/2019  . Fall 06/03/2016  . Healthcare maintenance 12/31/2015  . Elevated alkaline phosphatase level 12/21/2013  . Environmental allergies 12/16/2012  . Arthritis 12/16/2012  . Polycythemia vera ([place]) 06/12/2012  . Depression 06/12/2012  . Diverticulosis 06/12/2012  . Hypercholesterolemia 06/10/2012  . Essential hypertension, benign 06/10/2012    Past Surgical History:  Procedure Laterality Date  . ABDOMINAL HYSTERECTOMY   1970   ovaries left in place  . APPENDECTOMY     performed with hysterectomy  . BONE MARROW BIOPSY  08/2008  . CATARACT EXTRACTION  03/06/11   . CHOLECYSTECTOMY      Prior to Admission medications   Medication Sig Start Date End Date Taking? Authorizing Provider  acetaminophen (TYLENOL) 500 MG tablet Take 500 mg by mouth as needed for pain.    [provider]  aspirin 81 MG tablet Take 81 mg by mouth daily.    [provider]  docusate sodium (COLACE) 100 MG capsule Take 100 mg by mouth daily.    [provider]  folic acid (FOLVITE) 1 MG tablet Take 1 mg by mouth daily.    [provider]  hydrochlorothiazide (HYDRODIURIL) 25 MG tablet Take 0.5 tablets (12.5 mg total) by mouth daily. 05/07/19   Lorella Nimrod, MD  hydroxyurea (HYDREA) 500 MG capsule TAKE ONE CAPSULE BY MOUTH DAILY EXCEPT ON TUESDAY, TAKE TWO CAPSULES Patient taking differently: Take 500 mg by mouth daily.  04/16/16   Heath Lark, MD  Hypromellose (ARTIFICIAL TEARS) 0.4 % SOLN Apply 1 drop to eye daily at 12 noon.    [provider]  metoprolol tartrate (LOPRESSOR) 25 MG tablet Take 1 tablet (25 mg total) by mouth 2 (two) times daily. 05/07/19   Lorella Nimrod, MD  Multiple Vitamins-Minerals (MULTIVITAL PO) Take 1 tablet by mouth daily.    [provider]    Allergies Penicillins and Sulfa antibiotics  Family History  Problem Relation Age of Onset  .  Hypertension Mother   . Cancer Neg Hx        No colon or breast cancer    Social History Social History   Tobacco Use  . Smoking status: Never Smoker  . Smokeless tobacco: Never Used  Substance Use Topics  . Alcohol use: No    Alcohol/week: 0.0 standard drinks  . Drug use: No      Review of Systems Constitutional: No fever/chills concern for staff for increased confusion Eyes: No visual changes. ENT: No sore throat. Cardiovascular: Denies chest pain. Respiratory: Denies shortness of  breath. Gastrointestinal: No abdominal pain.  No nausea, no vomiting.  No diarrhea.  No constipation. Genitourinary: Negative for dysuria. Musculoskeletal: Negative for back pain. Skin: Negative for rash. Neurological: Negative for headaches, focal weakness or numbness. All other ROS negative ____________________________________________   PHYSICAL EXAM:  VITAL SIGNS: Blood pressure (!) 183/120, pulse 80, temperature 97.9 F (36.6 C), temperature source Oral, resp. rate 18, height '5\' 5"'$  (1.651 m), weight 79.4 kg, last menstrual period 06/10/1958, SpO2 97 %.  Constitutional: Alert and oriented x2. Well appearing and in no acute distress. Eyes: Conjunctivae are normal. EOMI. Head: Atraumatic. Nose: No congestion/rhinnorhea. Mouth/Throat: Mucous membranes are moist.   Neck: No stridor. Trachea Midline. FROM Cardiovascular: Normal rate, regular rhythm. Grossly normal heart sounds.  Good peripheral circulation. Respiratory: Normal respiratory effort.  No retractions. Lungs CTAB. Gastrointestinal: Soft and nontender. No distention. No abdominal bruits.  Musculoskeletal: No lower extremity tenderness nor edema.  No joint effusions. Neurologic:  Normal speech and language. No gross focal neurologic deficits are appreciated. Equal strength in arms and legs, no deficits noted.  Skin:  Skin is warm, dry and intact. No rash noted. Psychiatric: Mood and affect are normal. Speech and behavior are normal. GU: Deferred   ____________________________________________   LABS (all labs ordered are listed, but only abnormal results are displayed)  Labs Reviewed  CBC WITH DIFFERENTIAL/PLATELET - Abnormal; Notable for the following components:      Result Value   RDW 17.3 (*)    Monocytes Absolute 1.2 (*)    Basophils Absolute 0.2 (*)    All other components within normal limits  URINALYSIS, ROUTINE W REFLEX MICROSCOPIC - Abnormal; Notable for the following components:   Color, Urine YELLOW (*)     APPearance CLOUDY (*)    Hgb urine dipstick SMALL (*)    Protein, ur 100 (*)    Leukocytes,Ua MODERATE (*)    Bacteria, UA MANY (*)    All other components within normal limits  COMPREHENSIVE METABOLIC PANEL - Abnormal; Notable for the following components:   Glucose, Bld 103 (*)    Calcium 8.8 (*)    GFR calc non Af Amer 53 (*)    All other components within normal limits  URINE CULTURE   ____________________________________________   ED ECG REPORT I, Vanessa [place], the attending physician, personally viewed and interpreted this ECG.  Normal sinus rate of 81. No st elevation, twi III, normal intervals.  ____________________________________________  RADIOLOGY  Official radiology report(s): CT Head Wo Contrast  Result Date: 06/10/2019 CLINICAL DATA:  Headache. EXAM: CT HEAD WITHOUT CONTRAST TECHNIQUE: Contiguous axial images were obtained from the base of the skull through the vertex without intravenous contrast. COMPARISON:  August 24, 2010. FINDINGS: Brain: Mild diffuse cortical atrophy is noted. Mild chronic ischemic white matter disease is noted. No mass effect or midline shift is noted. Ventricular size is within normal limits. There is no evidence of mass lesion, hemorrhage or  acute infarction. Vascular: No hyperdense vessel or unexpected calcification. Skull: Normal. Negative for fracture or focal lesion. Sinuses/Orbits: No acute finding. Other: None. IMPRESSION: Mild diffuse cortical atrophy. Mild chronic ischemic white matter disease. No acute intracranial abnormality seen. Electronically Signed   By: Marijo Conception M.D.   On: 06/10/2019 16:48    ____________________________________________   PROCEDURES  Procedure(s) performed (including Critical Care):  Procedures   ____________________________________________   INITIAL IMPRESSION / ASSESSMENT AND PLAN / ED COURSE  SAHORY NORDLING was evaluated in Emergency Department on 06/10/2019 for the symptoms described in  the history of present illness. She was evaluated in the context of the global COVID-19 pandemic, which necessitated consideration that the patient might be at risk for infection with the SARS-CoV-2 virus that causes COVID-19. Institutional protocols and algorithms that pertain to the evaluation of patients at risk for COVID-19 are in a state of rapid change based on information released by regulatory bodies including the CDC and federal and state organizations. These policies and algorithms were followed during the patient's care in the ED.    Discussed with pts daughter on goals of care given age. They are okay with getting labs to evaluate for aki,electrolyte abnormalties, EKG to evaluate for arrythmia.  CT head to rule out ICH. We discussed that if she had a stroke it was a TIA given she is at baseline now and most likely due to afib.  Agree she is not a good candidate for anticoagulation.  At this time they do not want aggressive interventions done.  White count is normal no signs of infection.  No signs of anemia.  Her urine is somewhat difficult to interpret due to a significant squamous cells but given the increased urinary frequency we will give a dose of ceftriaxone and sent home on some Keflex.  I called the daughter again and she felt comfortable with this plan.  The facility also felt that she was not too far off her baseline and felt comfortable with this plan as well.  Patient has been talkative and interactive with staff.  Her blood pressures have come down on their own.  She is remained afebrile with A. fib that is rate controlled.  I discussed the provisional nature of ED diagnosis, the treatment so far, the ongoing plan of care, follow up appointments and return precautions with the patient and any family or support people present. They expressed understanding and agreed with the plan, discharged home.   ____________________________________________   FINAL CLINICAL IMPRESSION(S)  / ED DIAGNOSES   Final diagnoses:  Urinary tract infection without hematuria, site unspecified      MEDICATIONS GIVEN DURING THIS VISIT:  Medications  cefTRIAXone (ROCEPHIN) 1 g in sodium chloride 0.9 % 100 mL IVPB (1 g Intravenous New Bag/Given 06/10/19 1745)     ED Discharge Orders         Ordered    cephALEXin (KEFLEX) 250 MG capsule  4 times daily     06/10/19 1752           Note:  This document was prepared using Dragon voice recognition software and may include unintentional dictation errors.   Vanessa Hope, MD 06/10/19 [phone]

## 2019-06-10 NOTE — ED Notes (Signed)
Patient unable to sign for discharge. Verbal consent given by patient and daughter.

## 2019-06-11 LAB — URINE CULTURE: Culture: 60000 — AB

## 2019-11-04 ENCOUNTER — Inpatient Hospital Stay: Payer: Medicare Other | Attending: Internal Medicine

## 2019-11-04 ENCOUNTER — Encounter: Payer: Self-pay | Admitting: Internal Medicine

## 2019-11-04 ENCOUNTER — Other Ambulatory Visit: Payer: Self-pay

## 2019-11-04 ENCOUNTER — Inpatient Hospital Stay (HOSPITAL_BASED_OUTPATIENT_CLINIC_OR_DEPARTMENT_OTHER): Payer: Medicare Other | Admitting: Internal Medicine

## 2019-11-04 ENCOUNTER — Inpatient Hospital Stay: Payer: Medicare Other

## 2019-11-04 DIAGNOSIS — Z79899 Other long term (current) drug therapy: Secondary | ICD-10-CM | POA: Diagnosis not present

## 2019-11-04 DIAGNOSIS — D45 Polycythemia vera: Secondary | ICD-10-CM | POA: Insufficient documentation

## 2019-11-04 DIAGNOSIS — Z7982 Long term (current) use of aspirin: Secondary | ICD-10-CM | POA: Diagnosis not present

## 2019-11-04 LAB — CBC WITH DIFFERENTIAL/PLATELET
Abs Immature Granulocytes: 0.04 10*3/uL (ref 0.00–0.07)
Basophils Absolute: 0.2 10*3/uL — ABNORMAL HIGH (ref 0.0–0.1)
Basophils Relative: 3 %
Eosinophils Absolute: 0.2 10*3/uL (ref 0.0–0.5)
Eosinophils Relative: 2 %
HCT: 41.6 % (ref 36.0–46.0)
Hemoglobin: 13.1 g/dL (ref 12.0–15.0)
Immature Granulocytes: 1 %
Lymphocytes Relative: 23 %
Lymphs Abs: 1.8 10*3/uL (ref 0.7–4.0)
MCH: 30 pg (ref 26.0–34.0)
MCHC: 31.5 g/dL (ref 30.0–36.0)
MCV: 95.2 fL (ref 80.0–100.0)
Monocytes Absolute: 0.7 10*3/uL (ref 0.1–1.0)
Monocytes Relative: 9 %
Neutro Abs: 4.7 10*3/uL (ref 1.7–7.7)
Neutrophils Relative %: 62 %
Platelets: 436 10*3/uL — ABNORMAL HIGH (ref 150–400)
RBC: 4.37 MIL/uL (ref 3.87–5.11)
RDW: 15.6 % — ABNORMAL HIGH (ref 11.5–15.5)
WBC: 7.5 10*3/uL (ref 4.0–10.5)
nRBC: 0 % (ref 0.0–0.2)

## 2019-11-04 LAB — COMPREHENSIVE METABOLIC PANEL
ALT: 12 U/L (ref 0–44)
AST: 18 U/L (ref 15–41)
Albumin: 3.7 g/dL (ref 3.5–5.0)
Alkaline Phosphatase: 88 U/L (ref 38–126)
Anion gap: 8 (ref 5–15)
BUN: 18 mg/dL (ref 8–23)
CO2: 31 mmol/L (ref 22–32)
Calcium: 8.9 mg/dL (ref 8.9–10.3)
Chloride: 101 mmol/L (ref 98–111)
Creatinine, Ser: 0.83 mg/dL (ref 0.44–1.00)
GFR calc Af Amer: >60 mL/min (ref >60)
GFR calc non Af Amer: 60 mL/min — ABNORMAL LOW (ref >60)
Glucose, Bld: 122 mg/dL — ABNORMAL HIGH (ref 70–99)
Potassium: 3.7 mmol/L (ref 3.5–5.1)
Sodium: 140 mmol/L (ref 135–145)
Total Bilirubin: 0.6 mg/dL (ref 0.3–1.2)
Total Protein: 7.7 g/dL (ref 6.5–8.1)

## 2019-11-04 LAB — LACTATE DEHYDROGENASE: LDH: 131 U/L (ref 98–192)

## 2019-11-04 NOTE — Progress Notes (Signed)
[place] OFFICE PROGRESS NOTE  Patient Care Team: Einar Pheasant, MD as PCP - General (Internal Medicine)  HPI  SUMMARY of HEMATOLOGIC HISTORY:  Oncology History Overview Note  # POLYCYTHEMIA VERA s/p BMBx- MAY 2010- hypercellular; no blasts; LOW EPO;JAK-2 POSITIVE on Hydrea 7 pill/week; Asa/d   # Hard of hearing/ [facility]  DIAGNOSIS: POLYCYTHEMIA VERA        ;GOALS: control  CURRENT/MOST RECENT THERAPY ; hydrea    Polycythemia vera ([place])     INTERVAL HISTORY:  A very pleasant 86 year-old female patient with above history of polycythemia vera on Hydrea/periodic phlebotomies is here for follow-up.   Patient has not had any hospitalizations.  No strokes.  No unusual headaches.  No new thromboembolic events.  No weight loss.  No skin rash.   Review of Systems  Constitutional: Negative for chills, diaphoresis, fever, malaise/fatigue and weight loss.  HENT: Negative for nosebleeds and sore throat.        Chronic difficulty hearing.  Eyes: Negative for double vision.  Respiratory: Negative for hemoptysis, sputum production, shortness of breath and wheezing.   Cardiovascular: Negative for chest pain, palpitations, orthopnea and leg swelling.  Gastrointestinal: Negative for abdominal pain, blood in stool, constipation, diarrhea, melena, nausea and vomiting.  Genitourinary: Negative for dysuria, frequency and urgency.  Musculoskeletal:       Chronic joint pains.  Skin: Negative.  Negative for itching and rash.  Neurological: Negative for dizziness, tingling, focal weakness, weakness and headaches.  Endo/Heme/Allergies: Does not bruise/bleed easily.  Psychiatric/Behavioral: Negative for depression. The patient is not nervous/anxious and does not have insomnia.      I have reviewed the past medical history, past surgical history, social history and family history with the patient and they are unchanged from previous note unless stated  above.  ALLERGIES:  is allergic to penicillins and sulfa antibiotics.  MEDICATIONS:  Current Outpatient Medications  Medication Sig Dispense Refill  . acetaminophen (TYLENOL) 500 MG tablet Take 1,000 mg by mouth at bedtime.     Marland Kitchen acetaminophen (TYLENOL) 500 MG tablet Take 1,000 mg by mouth every 12 (twelve) hours as needed for mild pain or moderate pain.    Marland Kitchen aspirin 81 MG tablet Take 81 mg by mouth daily.    Marland Kitchen docusate sodium (COLACE) 100 MG capsule Take 100 mg by mouth daily.    . folic acid (FOLVITE) 1 MG tablet Take 1 mg by mouth daily.    . hydrochlorothiazide (HYDRODIURIL) 25 MG tablet Take 0.5 tablets (12.5 mg total) by mouth daily. 90 tablet 3  . hydroxyurea (HYDREA) 500 MG capsule Take 500 mg by mouth daily. Take 1 capsule (500mg ) by mouth every day EXCEPT on Tuesday    . hydroxyurea (HYDREA) 500 MG capsule Take 1,000 mg by mouth every Tuesday.    . Hypromellose (ARTIFICIAL TEARS) 0.4 % SOLN Place 1 drop into both eyes 2 (two) times daily.     . metoprolol tartrate (LOPRESSOR) 25 MG tablet Take 1 tablet (25 mg total) by mouth 2 (two) times daily. (Patient taking differently: Take 12.5 mg by mouth daily. ) 60 tablet 0  . Multiple Vitamins-Minerals (MULTIVITAL PO) Take 1 tablet by mouth daily.     No current facility-administered medications for this visit.    PHYSICAL EXAMINATION:   BP 135/80   Pulse 68   Temp 98.1 F (36.7 C) (Tympanic)   Resp 16   Ht 5\' 5"  (1.651 m)   LMP 06/10/1958   SpO2  98%   BMI 29.12 kg/m   There were no vitals filed for this visit.  Physical Exam Constitutional:      Comments: Patient sitting in a wheelchair.  She is accompanied by caregiver.  HENT:     Head: Normocephalic and atraumatic.     Mouth/Throat:     Pharynx: No oropharyngeal exudate.  Eyes:     Pupils: Pupils are equal, round, and reactive to light.  Cardiovascular:     Rate and Rhythm: Normal rate and regular rhythm.     Comments: Irregularly irregular heart rhythm.   Tachycardic. Pulmonary:     Effort: Pulmonary effort is normal. No respiratory distress.     Breath sounds: Normal breath sounds. No wheezing.  Abdominal:     General: Bowel sounds are normal. There is no distension.     Palpations: Abdomen is soft. There is no mass.     Tenderness: There is no abdominal tenderness. There is no guarding or rebound.  Musculoskeletal:        General: No tenderness. Normal range of motion.     Cervical back: Normal range of motion and neck supple.  Skin:    General: Skin is warm.  Neurological:     Mental Status: She is alert and oriented to person, place, and time.  Psychiatric:        Mood and Affect: Affect normal.     LABORATORY DATA:  I have reviewed the data as listed    Component Value Date/Time   NA 140 11/04/2019 1255   K 3.7 11/04/2019 1255   CL 101 11/04/2019 1255   CO2 31 11/04/2019 1255   GLUCOSE 122 (H) 11/04/2019 1255   BUN 18 11/04/2019 1255   CREATININE 0.83 11/04/2019 1255   CALCIUM 8.9 11/04/2019 1255   PROT 7.7 11/04/2019 1255   ALBUMIN 3.7 11/04/2019 1255   AST 18 11/04/2019 1255   ALT 12 11/04/2019 1255   ALKPHOS 88 11/04/2019 1255   BILITOT 0.6 11/04/2019 1255   GFRNONAA 60 (L) 11/04/2019 1255   GFRAA >60 11/04/2019 1255    No results found for: SPEP, UPEP  Lab Results  Component Value Date   WBC 7.5 11/04/2019   NEUTROABS 4.7 11/04/2019   HGB 13.1 11/04/2019   HCT 41.6 11/04/2019   MCV 95.2 11/04/2019   PLT 436 (H) 11/04/2019      Chemistry      Component Value Date/Time   NA 140 11/04/2019 1255   K 3.7 11/04/2019 1255   CL 101 11/04/2019 1255   CO2 31 11/04/2019 1255   BUN 18 11/04/2019 1255   CREATININE 0.83 11/04/2019 1255      Component Value Date/Time   CALCIUM 8.9 11/04/2019 1255   ALKPHOS 88 11/04/2019 1255   AST 18 11/04/2019 1255   ALT 12 11/04/2019 1255   BILITOT 0.6 11/04/2019 1255      ASSESSMENT & PLAN:  Polycythemia vera ([place]) # Polycythemia vera-JAK-2 positive.  Currently  asymptomatic.  Continue Hydrea 500 mg once a day.   #Hemoglobin today is 13; HCT 44; normal white count and platelet count- slightly elevated.  No concern for transformation. HOLD Phlebotomy today.  DISPOSITION:  #  No phlebotomy today.  # MD- CBC/CMP/LDH/ in 6 months/possible Phlebotomy-Dr.B     Cammie Sickle, MD 11/04/2019 3:59 PM

## 2019-11-04 NOTE — Assessment & Plan Note (Addendum)
#   Polycythemia vera-JAK-2 positive.  Currently asymptomatic.  Continue Hydrea 500 mg once a day.   #Hemoglobin today is 13; HCT 44; normal white count and platelet count- slightly elevated.  No concern for transformation. HOLD Phlebotomy today.  DISPOSITION:  #  No phlebotomy today.  # MD- CBC/CMP/LDH/ in 6 months/possible Phlebotomy-Dr.B

## 2019-11-11 ENCOUNTER — Telehealth: Payer: Self-pay | Admitting: Internal Medicine

## 2019-11-11 NOTE — Telephone Encounter (Signed)
Patient's daughter phoned on this date and asked that patient's paperwork be sent to [facility] as patient will be moving there. Writer stated that Probation officer could not help with this, but that Probation officer would forward this request to Medical Records. Daughter agreed with this. Information forwarded to [facility] Records.

## 2020-05-04 ENCOUNTER — Other Ambulatory Visit: Payer: Self-pay | Admitting: *Deleted

## 2020-05-04 ENCOUNTER — Inpatient Hospital Stay (HOSPITAL_BASED_OUTPATIENT_CLINIC_OR_DEPARTMENT_OTHER): Payer: Medicare Other | Admitting: Oncology

## 2020-05-04 ENCOUNTER — Inpatient Hospital Stay: Payer: Medicare Other | Attending: Oncology

## 2020-05-04 ENCOUNTER — Inpatient Hospital Stay: Payer: Medicare Other

## 2020-05-04 VITALS — BP 135/75 | HR 70 | Temp 99.3°F | Resp 18

## 2020-05-04 DIAGNOSIS — D45 Polycythemia vera: Secondary | ICD-10-CM | POA: Diagnosis not present

## 2020-05-04 DIAGNOSIS — F0390 Unspecified dementia without behavioral disturbance: Secondary | ICD-10-CM | POA: Diagnosis not present

## 2020-05-04 DIAGNOSIS — Z79899 Other long term (current) drug therapy: Secondary | ICD-10-CM | POA: Diagnosis not present

## 2020-05-04 DIAGNOSIS — Z7982 Long term (current) use of aspirin: Secondary | ICD-10-CM | POA: Diagnosis not present

## 2020-05-04 LAB — CBC WITH DIFFERENTIAL/PLATELET
Abs Immature Granulocytes: 0.04 10*3/uL (ref 0.00–0.07)
Basophils Absolute: 0.3 10*3/uL — ABNORMAL HIGH (ref 0.0–0.1)
Basophils Relative: 3 %
Eosinophils Absolute: 0.2 10*3/uL (ref 0.0–0.5)
Eosinophils Relative: 2 %
HCT: 46.1 % — ABNORMAL HIGH (ref 36.0–46.0)
Hemoglobin: 14.5 g/dL (ref 12.0–15.0)
Immature Granulocytes: 0 %
Lymphocytes Relative: 24 %
Lymphs Abs: 2.2 10*3/uL (ref 0.7–4.0)
MCH: 28.3 pg (ref 26.0–34.0)
MCHC: 31.5 g/dL (ref 30.0–36.0)
MCV: 90 fL (ref 80.0–100.0)
Monocytes Absolute: 0.7 10*3/uL (ref 0.1–1.0)
Monocytes Relative: 8 %
Neutro Abs: 6 10*3/uL (ref 1.7–7.7)
Neutrophils Relative %: 63 %
Platelets: 412 10*3/uL — ABNORMAL HIGH (ref 150–400)
RBC: 5.12 MIL/uL — ABNORMAL HIGH (ref 3.87–5.11)
RDW: 18.1 % — ABNORMAL HIGH (ref 11.5–15.5)
WBC: 9.4 10*3/uL (ref 4.0–10.5)
nRBC: 0 % (ref 0.0–0.2)

## 2020-05-04 LAB — COMPREHENSIVE METABOLIC PANEL
ALT: 12 U/L (ref 0–44)
AST: 21 U/L (ref 15–41)
Albumin: 4 g/dL (ref 3.5–5.0)
Alkaline Phosphatase: 84 U/L (ref 38–126)
Anion gap: 10 (ref 5–15)
BUN: 19 mg/dL (ref 8–23)
CO2: 28 mmol/L (ref 22–32)
Calcium: 9.3 mg/dL (ref 8.9–10.3)
Chloride: 99 mmol/L (ref 98–111)
Creatinine, Ser: 0.83 mg/dL (ref 0.44–1.00)
GFR, Estimated: >60 mL/min (ref >60)
Glucose, Bld: 110 mg/dL — ABNORMAL HIGH (ref 70–99)
Potassium: 3.4 mmol/L — ABNORMAL LOW (ref 3.5–5.1)
Sodium: 137 mmol/L (ref 135–145)
Total Bilirubin: 0.7 mg/dL (ref 0.3–1.2)
Total Protein: 8.2 g/dL — ABNORMAL HIGH (ref 6.5–8.1)

## 2020-05-04 LAB — LACTATE DEHYDROGENASE: LDH: 136 U/L (ref 98–192)

## 2020-05-04 NOTE — Progress Notes (Signed)
[facility] OFFICE PROGRESS NOTE  Patient Care Team: [place], MD as PCP - General (Internal Medicine)  HPI  SUMMARY of HEMATOLOGIC HISTORY:  Oncology History Overview Note  # POLYCYTHEMIA VERA s/p BMBx- MAY 2010- hypercellular; no blasts; LOW EPO;JAK-2 POSITIVE on Hydrea 7 pill/week; Asa/d   # Hard of hearing/ [facility]  DIAGNOSIS: POLYCYTHEMIA VERA        ;GOALS: control  CURRENT/MOST RECENT THERAPY ; hydrea    Polycythemia vera ([facility])     INTERVAL HISTORY:  A very pleasant 87 year-old female patient with above history of polycythemia vera on Hydrea/periodic phlebotomies is here for follow-up.  She was last seen in clinic in July 2021.  Her last phlebotomy was on 02/06/2017.  Hemoglobin was 15.3 hematocrit 45.6  Patient has not had any hospitalizations.  No strokes.  No unusual headaches.  No new thromboembolic events.  No weight loss.  No skin rash. Chronic dementia.   Review of Systems  Constitutional: Negative for chills, diaphoresis, fever, malaise/fatigue and weight loss.  HENT: Negative for nosebleeds and sore throat.        Chronic difficulty hearing.  Eyes: Negative for double vision.  Respiratory: Negative for hemoptysis, sputum production, shortness of breath and wheezing.   Cardiovascular: Negative for chest pain, palpitations, orthopnea and leg swelling.  Gastrointestinal: Negative for abdominal pain, blood in stool, constipation, diarrhea, melena, nausea and vomiting.  Genitourinary: Negative for dysuria, frequency and urgency.  Musculoskeletal:       Chronic joint pains.  Skin: Negative.  Negative for itching and rash.  Neurological: Negative for dizziness, tingling, focal weakness, weakness and headaches.  Endo/Heme/Allergies: Does not bruise/bleed easily.  Psychiatric/Behavioral: Negative for depression. The patient is not nervous/anxious and does not have insomnia.      I have reviewed the past medical history,  past surgical history, social history and family history with the patient and they are unchanged from previous note unless stated above.  ALLERGIES:  is allergic to penicillins and sulfa antibiotics.  MEDICATIONS:  Current Outpatient Medications  Medication Sig Dispense Refill  . acetaminophen (TYLENOL) 500 MG tablet Take 1,000 mg by mouth at bedtime.    Marland Kitchen aspirin 81 MG tablet Take 81 mg by mouth daily.    Marland Kitchen docusate sodium (COLACE) 100 MG capsule Take 100 mg by mouth daily.    Marland Kitchen escitalopram (LEXAPRO) 5 MG tablet Take 5 mg by mouth daily.    . folic acid (FOLVITE) 1 MG tablet Take 1 mg by mouth daily.    . hydrochlorothiazide (HYDRODIURIL) 25 MG tablet Take 0.5 tablets (12.5 mg total) by mouth daily. 90 tablet 3  . hydroxyurea (HYDREA) 500 MG capsule Take 500 mg by mouth daily. Take 1 capsule (500mg ) by mouth every day EXCEPT on Tuesday    . hydroxyurea (HYDREA) 500 MG capsule Take 1,000 mg by mouth every Tuesday.    . Hypromellose 0.4 % SOLN Place 1 drop into both eyes 2 (two) times daily.     . metoprolol tartrate (LOPRESSOR) 25 MG tablet Take 1 tablet (25 mg total) by mouth 2 (two) times daily. (Patient taking differently: Take 12.5 mg by mouth daily.) 60 tablet 0  . Multiple Vitamins-Minerals (MULTIVITAL PO) Take 1 tablet by mouth daily.    Saturday acetaminophen (TYLENOL) 500 MG tablet Take 1,000 mg by mouth every 12 (twelve) hours as needed for mild pain or moderate pain.     No current facility-administered medications for this visit.    PHYSICAL  EXAMINATION:   BP 135/75   Pulse 70   Temp 99.3 F (37.4 C)   Resp 18   LMP 06/10/1958   SpO2 98%   There were no vitals filed for this visit.  Physical Exam Constitutional:      General: Vital signs are normal.     Appearance: Normal appearance.  HENT:     Head: Normocephalic and atraumatic.  Eyes:     Pupils: Pupils are equal, round, and reactive to light.  Cardiovascular:     Rate and Rhythm: Normal rate and regular rhythm.      Heart sounds: Normal heart sounds. No murmur heard.   Pulmonary:     Effort: Pulmonary effort is normal.     Breath sounds: Normal breath sounds. No wheezing.  Abdominal:     General: Bowel sounds are normal. There is no distension.     Palpations: Abdomen is soft.     Tenderness: There is no abdominal tenderness.  Musculoskeletal:        General: No edema. Normal range of motion.     Cervical back: Normal range of motion.  Skin:    General: Skin is warm and dry.     Findings: No rash.  Neurological:     Mental Status: She is alert. Mental status is at baseline.     Gait: Gait is intact.  Psychiatric:        Mood and Affect: Mood and affect normal.        Cognition and Memory: Memory normal.        Judgment: Judgment normal.     LABORATORY DATA:  I have reviewed the data as listed    Component Value Date/Time   NA 140 11/04/2019 1255   K 3.7 11/04/2019 1255   CL 101 11/04/2019 1255   CO2 31 11/04/2019 1255   GLUCOSE 122 (H) 11/04/2019 1255   BUN 18 11/04/2019 1255   CREATININE 0.83 11/04/2019 1255   CALCIUM 8.9 11/04/2019 1255   PROT 7.7 11/04/2019 1255   ALBUMIN 3.7 11/04/2019 1255   AST 18 11/04/2019 1255   ALT 12 11/04/2019 1255   ALKPHOS 88 11/04/2019 1255   BILITOT 0.6 11/04/2019 1255   GFRNONAA 60 (L) 11/04/2019 1255   GFRAA >60 11/04/2019 1255    No results found for: SPEP, UPEP  Lab Results  Component Value Date   WBC 9.4 05/04/2020   NEUTROABS 6.0 05/04/2020   HGB 14.5 05/04/2020   HCT 46.1 (H) 05/04/2020   MCV 90.0 05/04/2020   PLT 412 (H) 05/04/2020      Chemistry      Component Value Date/Time   NA 140 11/04/2019 1255   K 3.7 11/04/2019 1255   CL 101 11/04/2019 1255   CO2 31 11/04/2019 1255   BUN 18 11/04/2019 1255   CREATININE 0.83 11/04/2019 1255      Component Value Date/Time   CALCIUM 8.9 11/04/2019 1255   ALKPHOS 88 11/04/2019 1255   AST 18 11/04/2019 1255   ALT 12 11/04/2019 1255   BILITOT 0.6 11/04/2019 1255       ASSESSMENT & PLAN:  # Polycythemia vera- -JAK-2 positive.   -Currently asymptomatic.   -Continue Hydrea 500 mg once a day.  -Hemoglobin today  Is 14.5, hematocrit 46.1 with a normal WBC and platelet count. -Hold phlebotomy although labs have slightly trended up -RTC in 3 months for lab work only (CBC).  -RTC in 6 months for labs, md assessment and possible phlebotomy.  DISPOSITION:  -No phlebotomy today.  -RTC in 3 months for labs only   Greater than 50% was spent in counseling and coordination of care with this patient including but not limited to discussion of the relevant topics above (See A&P) including, but not limited to diagnosis and management of acute and chronic medical conditions.    No problem-specific Assessment & Plan notes found for this encounter.     Jacquelin Hawking, NP 05/04/2020 1:50 PM

## 2020-05-04 NOTE — Progress Notes (Signed)
No phlebotomy today per NP. 

## 2020-05-10 ENCOUNTER — Telehealth: Payer: Self-pay | Admitting: *Deleted

## 2020-05-10 NOTE — Telephone Encounter (Signed)
[facility] has been trying to verify an order given by Rulon Abide on 12-15. This patient takes Hydroxyurea 500mg  everyday except on Tuesday its 1000mg . apparently the order when she returned was 500mg  daily. Can you confirm this and contact [place] at [facility] # [phone] ext [phone].

## 2020-05-11 NOTE — Telephone Encounter (Signed)
RN [employer] and clarified orders. Patient should be taking hydroxyurea 500 mg daily, every day except 1000 mg on Tuesday. Read back process performed with RN supervisor at [facility].

## 2020-08-03 ENCOUNTER — Inpatient Hospital Stay: Payer: Medicare Other

## 2020-08-03 ENCOUNTER — Other Ambulatory Visit: Payer: Self-pay

## 2020-08-03 ENCOUNTER — Inpatient Hospital Stay: Payer: Medicare Other | Attending: Internal Medicine

## 2020-08-03 DIAGNOSIS — D45 Polycythemia vera: Secondary | ICD-10-CM | POA: Insufficient documentation

## 2020-08-03 LAB — CBC WITH DIFFERENTIAL/PLATELET
Abs Immature Granulocytes: 0.06 10*3/uL (ref 0.00–0.07)
Basophils Absolute: 0.4 10*3/uL — ABNORMAL HIGH (ref 0.0–0.1)
Basophils Relative: 3 %
Eosinophils Absolute: 0.3 10*3/uL (ref 0.0–0.5)
Eosinophils Relative: 3 %
HCT: 44.5 % (ref 36.0–46.0)
Hemoglobin: 13.7 g/dL (ref 12.0–15.0)
Immature Granulocytes: 1 %
Lymphocytes Relative: 22 %
Lymphs Abs: 2.5 10*3/uL (ref 0.7–4.0)
MCH: 28.1 pg (ref 26.0–34.0)
MCHC: 30.8 g/dL (ref 30.0–36.0)
MCV: 91.4 fL (ref 80.0–100.0)
Monocytes Absolute: 1.3 10*3/uL — ABNORMAL HIGH (ref 0.1–1.0)
Monocytes Relative: 11 %
Neutro Abs: 7 10*3/uL (ref 1.7–7.7)
Neutrophils Relative %: 60 %
Platelets: 649 10*3/uL — ABNORMAL HIGH (ref 150–400)
RBC: 4.87 MIL/uL (ref 3.87–5.11)
RDW: 16.4 % — ABNORMAL HIGH (ref 11.5–15.5)
WBC: 11.4 10*3/uL — ABNORMAL HIGH (ref 4.0–10.5)
nRBC: 0 % (ref 0.0–0.2)

## 2020-08-03 NOTE — Progress Notes (Signed)
HCT 44 today. Reviewed orders. No phlebotomy required today. Gave pt a copy of lab results and Return appts to [facility].

## 2020-11-02 ENCOUNTER — Inpatient Hospital Stay: Payer: Medicare Other

## 2020-11-02 ENCOUNTER — Inpatient Hospital Stay: Payer: Medicare Other | Admitting: Internal Medicine

## 2020-11-04 ENCOUNTER — Telehealth: Payer: Self-pay | Admitting: *Deleted

## 2020-11-04 NOTE — Telephone Encounter (Signed)
"  Kelly from [employer] called wanting to know if Dr B would want them to draw labs since she missed her appointment this week.  apparently the family thought that it was  just for labs.  when I looked the patient was also scheduled to see MD and maybe a phlebotomy. "  RN to send orders to [facility] to draw. We can determine the plan of care once the labs are received.  Contacted Claiborne Billings at [facility] [phone]

## 2020-11-04 NOTE — Telephone Encounter (Signed)
Spoke with [employer]. She will get with Dr. Ouida Sills or facility ordering provider and will fax the lab results to our office.

## 2020-11-08 ENCOUNTER — Encounter: Payer: Self-pay | Admitting: Internal Medicine

## 2020-11-08 ENCOUNTER — Telehealth: Payer: Self-pay | Admitting: *Deleted

## 2020-11-08 NOTE — Telephone Encounter (Signed)
Dr. Jacinto Reap - please review labs from assisted living that were faxed over. Labs in your inbasket.

## 2020-11-09 ENCOUNTER — Encounter: Payer: Self-pay | Admitting: Internal Medicine

## 2020-11-09 ENCOUNTER — Telehealth: Payer: Self-pay | Admitting: *Deleted

## 2020-11-09 NOTE — Telephone Encounter (Signed)
[facility] -  provide new apts to [facility].

## 2020-11-09 NOTE — Telephone Encounter (Signed)
Hand off communication sent to [facility]. Faxed this phone note to [phone]

## 2020-11-09 NOTE — Telephone Encounter (Signed)
-----   Message from Cammie Sickle, MD sent at 11/08/2020  9:22 PM EDT ----- H/T-please inform patient/family that overall counts are stable-platelets are still elevated at 600+.  However given her age/I think is reasonable to continue Hydrea 500 mg once a day as currently ordered.  #Follow-up in 6 months-MD; labs-CBC CMP LDH. GB

## 2020-12-28 ENCOUNTER — Ambulatory Visit: Payer: Medicare Other | Admitting: Internal Medicine

## 2020-12-28 ENCOUNTER — Other Ambulatory Visit: Payer: Medicare Other

## 2021-03-29 ENCOUNTER — Encounter: Payer: Self-pay | Admitting: Internal Medicine

## 2021-03-30 ENCOUNTER — Encounter: Payer: Self-pay | Admitting: Internal Medicine

## 2021-03-30 DEATH — deceased

## 2021-04-05 ENCOUNTER — Ambulatory Visit: Payer: Medicare Other | Admitting: Dermatology

## 2021-04-13 ENCOUNTER — Telehealth: Payer: Self-pay | Admitting: Internal Medicine

## 2021-04-13 NOTE — Telephone Encounter (Signed)
Patient deceased 03/05/2021 per [facility] care everywhere

## 2021-05-10 ENCOUNTER — Other Ambulatory Visit: Payer: Medicare Other

## 2021-05-10 ENCOUNTER — Ambulatory Visit: Payer: Medicare Other | Admitting: Internal Medicine
